# Patient Record
Sex: Female | Born: 1970 | Race: Black or African American | Hispanic: No | Marital: Married | Smoking: Never smoker
Health system: Southern US, Community
[De-identification: ages and names within clinical notes are randomized; demographics above are authoritative.]

## PROBLEM LIST (undated history)

## (undated) ENCOUNTER — Emergency Department (HOSPITAL_COMMUNITY): Payer: BC Managed Care – PPO | Source: Home / Self Care

## (undated) DIAGNOSIS — D509 Iron deficiency anemia, unspecified: Secondary | ICD-10-CM

## (undated) DIAGNOSIS — D649 Anemia, unspecified: Secondary | ICD-10-CM

## (undated) DIAGNOSIS — E785 Hyperlipidemia, unspecified: Secondary | ICD-10-CM

## (undated) DIAGNOSIS — I10 Essential (primary) hypertension: Secondary | ICD-10-CM

## (undated) HISTORY — DX: Hyperlipidemia, unspecified: E78.5

## (undated) HISTORY — DX: Anemia, unspecified: D64.9

## (undated) HISTORY — DX: Iron deficiency anemia, unspecified: D50.9

---

## 2000-07-05 ENCOUNTER — Other Ambulatory Visit: Admission: RE | Admit: 2000-07-05 | Discharge: 2000-07-05 | Payer: Self-pay | Admitting: Obstetrics

## 2002-07-15 ENCOUNTER — Encounter: Payer: Self-pay | Admitting: Internal Medicine

## 2002-07-15 ENCOUNTER — Encounter: Admission: RE | Admit: 2002-07-15 | Discharge: 2002-07-15 | Payer: Self-pay | Admitting: Internal Medicine

## 2004-10-11 ENCOUNTER — Ambulatory Visit: Payer: Self-pay | Admitting: Internal Medicine

## 2005-07-18 ENCOUNTER — Ambulatory Visit (HOSPITAL_COMMUNITY): Admission: RE | Admit: 2005-07-18 | Discharge: 2005-07-18 | Payer: Self-pay | Admitting: Obstetrics

## 2006-12-26 ENCOUNTER — Ambulatory Visit: Payer: Self-pay | Admitting: Internal Medicine

## 2007-03-26 ENCOUNTER — Ambulatory Visit: Payer: Self-pay | Admitting: Internal Medicine

## 2007-03-26 ENCOUNTER — Encounter: Payer: Self-pay | Admitting: Internal Medicine

## 2007-03-26 DIAGNOSIS — I10 Essential (primary) hypertension: Secondary | ICD-10-CM | POA: Insufficient documentation

## 2007-03-26 LAB — CONVERTED CEMR LAB
Alkaline Phosphatase: 43 units/L (ref 39–117)
BUN: 4 mg/dL — ABNORMAL LOW (ref 6–23)
Basophils Relative: 1.1 % — ABNORMAL HIGH (ref 0.0–1.0)
Bilirubin, Direct: 0.3 mg/dL (ref 0.0–0.3)
CO2: 25 meq/L (ref 19–32)
Eosinophils Absolute: 0.1 10*3/uL (ref 0.0–0.6)
GFR calc Af Amer: 104 mL/min
GFR calc non Af Amer: 86 mL/min
Hemoglobin: 11.7 g/dL — ABNORMAL LOW (ref 12.0–15.0)
Lymphocytes Relative: 24.4 % (ref 12.0–46.0)
MCHC: 34.9 g/dL (ref 30.0–36.0)
MCV: 96 fL (ref 78.0–100.0)
Monocytes Absolute: 0.6 10*3/uL (ref 0.2–0.7)
Monocytes Relative: 12.8 % — ABNORMAL HIGH (ref 3.0–11.0)
Neutro Abs: 3 10*3/uL (ref 1.4–7.7)
Potassium: 4.2 meq/L (ref 3.5–5.1)
TSH: 1.86 microintl units/mL (ref 0.35–5.50)
Total Protein: 7.4 g/dL (ref 6.0–8.3)

## 2007-07-09 ENCOUNTER — Ambulatory Visit: Payer: Self-pay | Admitting: Internal Medicine

## 2007-07-09 DIAGNOSIS — R21 Rash and other nonspecific skin eruption: Secondary | ICD-10-CM | POA: Insufficient documentation

## 2008-01-09 ENCOUNTER — Ambulatory Visit: Payer: Self-pay | Admitting: Internal Medicine

## 2008-01-10 LAB — CONVERTED CEMR LAB
AST: 22 units/L (ref 0–37)
Albumin: 3.8 g/dL (ref 3.5–5.2)
Alkaline Phosphatase: 43 units/L (ref 39–117)
BUN: 7 mg/dL (ref 6–23)
CO2: 25 meq/L (ref 19–32)
Chloride: 102 meq/L (ref 96–112)
Eosinophils Relative: 1.3 % (ref 0.0–5.0)
Glucose, Bld: 103 mg/dL — ABNORMAL HIGH (ref 70–99)
HCT: 35.1 % — ABNORMAL LOW (ref 36.0–46.0)
Monocytes Relative: 6.4 % (ref 3.0–12.0)
Neutrophils Relative %: 72.1 % (ref 43.0–77.0)
Platelets: 307 10*3/uL (ref 150–400)
Potassium: 3.5 meq/L (ref 3.5–5.1)
Total Protein: 7.5 g/dL (ref 6.0–8.3)
WBC: 3.8 10*3/uL — ABNORMAL LOW (ref 4.5–10.5)

## 2008-01-15 ENCOUNTER — Ambulatory Visit: Payer: Self-pay | Admitting: Internal Medicine

## 2008-11-27 ENCOUNTER — Emergency Department (HOSPITAL_COMMUNITY): Admission: EM | Admit: 2008-11-27 | Discharge: 2008-11-27 | Payer: Self-pay | Admitting: Internal Medicine

## 2009-05-31 ENCOUNTER — Ambulatory Visit (HOSPITAL_COMMUNITY): Admission: RE | Admit: 2009-05-31 | Discharge: 2009-05-31 | Payer: Self-pay | Admitting: Obstetrics

## 2009-06-30 ENCOUNTER — Ambulatory Visit (HOSPITAL_COMMUNITY): Admission: RE | Admit: 2009-06-30 | Discharge: 2009-06-30 | Payer: Self-pay | Admitting: Obstetrics

## 2009-07-13 ENCOUNTER — Ambulatory Visit (HOSPITAL_COMMUNITY): Admission: RE | Admit: 2009-07-13 | Discharge: 2009-07-13 | Payer: Self-pay | Admitting: Obstetrics

## 2009-08-02 ENCOUNTER — Ambulatory Visit (HOSPITAL_COMMUNITY): Admission: RE | Admit: 2009-08-02 | Discharge: 2009-08-02 | Payer: Self-pay | Admitting: Obstetrics

## 2009-08-23 ENCOUNTER — Ambulatory Visit (HOSPITAL_COMMUNITY): Admission: RE | Admit: 2009-08-23 | Discharge: 2009-08-23 | Payer: Self-pay | Admitting: Obstetrics

## 2009-11-15 ENCOUNTER — Ambulatory Visit (HOSPITAL_COMMUNITY): Admission: RE | Admit: 2009-11-15 | Discharge: 2009-11-15 | Payer: Self-pay | Admitting: Obstetrics

## 2009-11-29 ENCOUNTER — Ambulatory Visit (HOSPITAL_COMMUNITY): Admission: RE | Admit: 2009-11-29 | Discharge: 2009-11-29 | Payer: Self-pay | Admitting: Obstetrics

## 2009-12-06 ENCOUNTER — Ambulatory Visit (HOSPITAL_COMMUNITY): Admission: RE | Admit: 2009-12-06 | Discharge: 2009-12-06 | Payer: Self-pay | Admitting: Obstetrics

## 2009-12-13 ENCOUNTER — Ambulatory Visit (HOSPITAL_COMMUNITY): Admission: RE | Admit: 2009-12-13 | Discharge: 2009-12-13 | Payer: Self-pay | Admitting: Obstetrics

## 2009-12-13 ENCOUNTER — Ambulatory Visit (HOSPITAL_COMMUNITY): Admission: AD | Admit: 2009-12-13 | Discharge: 2009-12-13 | Payer: Self-pay | Admitting: Obstetrics

## 2009-12-16 ENCOUNTER — Ambulatory Visit (HOSPITAL_COMMUNITY): Admission: RE | Admit: 2009-12-16 | Discharge: 2009-12-16 | Payer: Self-pay | Admitting: Obstetrics

## 2009-12-20 ENCOUNTER — Ambulatory Visit (HOSPITAL_COMMUNITY): Admission: RE | Admit: 2009-12-20 | Discharge: 2009-12-20 | Payer: Self-pay | Admitting: Obstetrics

## 2009-12-23 ENCOUNTER — Ambulatory Visit (HOSPITAL_COMMUNITY): Admission: RE | Admit: 2009-12-23 | Discharge: 2009-12-23 | Payer: Self-pay | Admitting: Obstetrics

## 2009-12-27 ENCOUNTER — Ambulatory Visit (HOSPITAL_COMMUNITY): Admission: RE | Admit: 2009-12-27 | Discharge: 2009-12-27 | Payer: Self-pay | Admitting: Obstetrics

## 2009-12-30 ENCOUNTER — Ambulatory Visit (HOSPITAL_COMMUNITY): Admission: RE | Admit: 2009-12-30 | Discharge: 2009-12-30 | Payer: Self-pay | Admitting: Obstetrics

## 2010-01-03 ENCOUNTER — Ambulatory Visit (HOSPITAL_COMMUNITY): Admission: RE | Admit: 2010-01-03 | Discharge: 2010-01-03 | Payer: Self-pay | Admitting: Obstetrics

## 2010-01-06 ENCOUNTER — Ambulatory Visit (HOSPITAL_COMMUNITY): Admission: RE | Admit: 2010-01-06 | Discharge: 2010-01-06 | Payer: Self-pay | Admitting: Obstetrics

## 2010-01-10 ENCOUNTER — Ambulatory Visit (HOSPITAL_COMMUNITY): Admission: RE | Admit: 2010-01-10 | Discharge: 2010-01-10 | Payer: Self-pay | Admitting: Obstetrics

## 2010-01-13 ENCOUNTER — Ambulatory Visit (HOSPITAL_COMMUNITY): Admission: RE | Admit: 2010-01-13 | Discharge: 2010-01-13 | Payer: Self-pay | Admitting: Obstetrics

## 2010-01-17 ENCOUNTER — Inpatient Hospital Stay (HOSPITAL_COMMUNITY): Admission: AD | Admit: 2010-01-17 | Discharge: 2010-01-17 | Payer: Self-pay | Admitting: Obstetrics

## 2010-01-19 ENCOUNTER — Inpatient Hospital Stay (HOSPITAL_COMMUNITY): Admission: RE | Admit: 2010-01-19 | Discharge: 2010-01-23 | Payer: Self-pay | Admitting: Obstetrics

## 2010-09-11 ENCOUNTER — Encounter: Payer: Self-pay | Admitting: Obstetrics

## 2010-11-07 LAB — CBC
HCT: 31.7 % — ABNORMAL LOW (ref 36.0–46.0)
MCHC: 35.3 g/dL (ref 30.0–36.0)
Platelets: 228 10*3/uL (ref 150–400)
RBC: 2.49 MIL/uL — ABNORMAL LOW (ref 3.87–5.11)
RDW: 13.7 % (ref 11.5–15.5)
WBC: 11.1 10*3/uL — ABNORMAL HIGH (ref 4.0–10.5)

## 2010-11-30 LAB — URINALYSIS, ROUTINE W REFLEX MICROSCOPIC
Glucose, UA: NEGATIVE mg/dL
Protein, ur: NEGATIVE mg/dL
Specific Gravity, Urine: 1.021 (ref 1.005–1.030)
pH: 5.5 (ref 5.0–8.0)

## 2010-11-30 LAB — URINE MICROSCOPIC-ADD ON

## 2010-11-30 LAB — POCT I-STAT, CHEM 8
BUN: 9 mg/dL (ref 6–23)
Creatinine, Ser: 1 mg/dL (ref 0.4–1.2)
Sodium: 138 mEq/L (ref 135–145)
TCO2: 23 mmol/L (ref 0–100)

## 2010-11-30 LAB — CBC
MCV: 95.1 fL (ref 78.0–100.0)
RBC: 3.23 MIL/uL — ABNORMAL LOW (ref 3.87–5.11)
WBC: 6.5 10*3/uL (ref 4.0–10.5)

## 2011-01-06 NOTE — Assessment & Plan Note (Signed)
[facility]                                 ON-CALL NOTE   MELVIN, MARMO                        MRN:          [id number]  DATE:01/17/2007                            DOB:          May 19, 1971    TIME:  6:53 p.m.   PHONE NUMBER:  [phone].   OBJECTIVE:  Patient needs medications.  Pt called earlier for  Allegra-  D.  Her symptoms: her eyes and nose are running and itching.  Typically  uses Allegra-D.  She has past medical history significant only for  hypertension.   ASSESSMENT:  Allergies with hypertension.   PLAN:  Suggest she avoid the D if possible.  She has been on plain  Allegra before, said it was not effective.  Suggested she try plain  Zyrtec or cetirizine, which she can get over the counter.  If that is  not totally acceptable, would call and be seen, possibly use nasal  inhaler.   PRIMARY CARE PHYSICIAN:  Georgina Quint. Plotnikov, M.D.   HOME OFFICE:  Hope Budds, MD  Electronically Signed    RNS/MedQ  DD: 01/17/2007  DT: 01/18/2007  Job #: [id number]

## 2011-06-06 ENCOUNTER — Other Ambulatory Visit (HOSPITAL_COMMUNITY): Payer: Self-pay | Admitting: Obstetrics

## 2011-06-06 DIAGNOSIS — Z1231 Encounter for screening mammogram for malignant neoplasm of breast: Secondary | ICD-10-CM

## 2011-06-23 ENCOUNTER — Ambulatory Visit (HOSPITAL_COMMUNITY)
Admission: RE | Admit: 2011-06-23 | Discharge: 2011-06-23 | Disposition: A | Discharge status: Home / Self Care | Payer: Self-pay | Source: Ambulatory Visit | Attending: Obstetrics | Admitting: Obstetrics

## 2011-06-23 DIAGNOSIS — Z1231 Encounter for screening mammogram for malignant neoplasm of breast: Secondary | ICD-10-CM | POA: Insufficient documentation

## 2011-06-27 LAB — HM MAMMOGRAPHY: HM Mammogram: NORMAL

## 2011-09-06 IMAGING — US US OB LIMITED
1 series · 14 of 27 positions shown · non-contrast
Comparison: none

OBSTETRICAL ULTRASOUND:
 This ultrasound was performed in The [HOSPITAL], and the [facility] report will be stored to [REDACTED] PACS.  This report is also available in [HOSPITAL]?s accessANYware.

[Series 1: us ob limited · 14 of 27 slices shown]
[im 1/27]
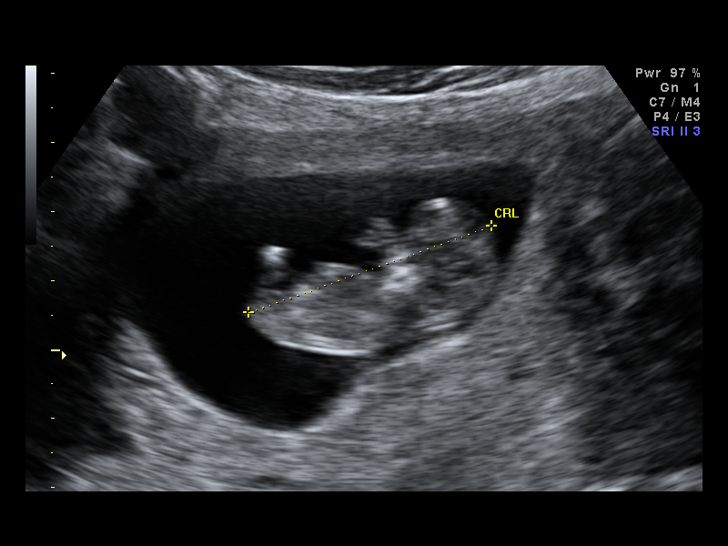
[im 3/27]
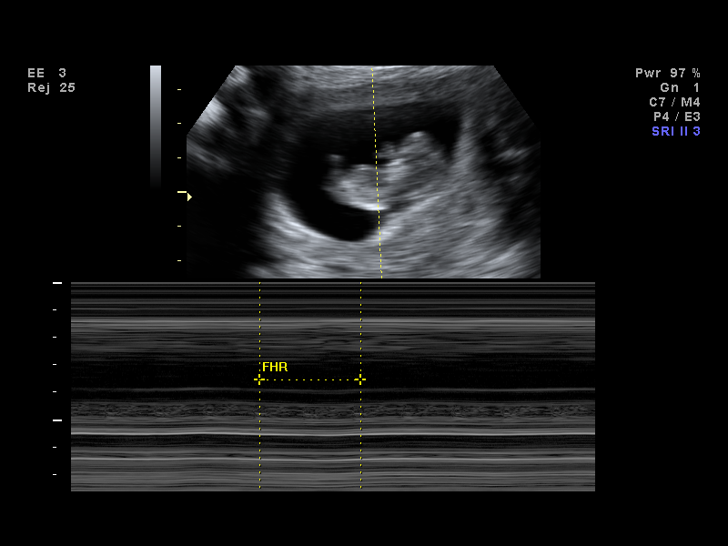
[im 5/27]
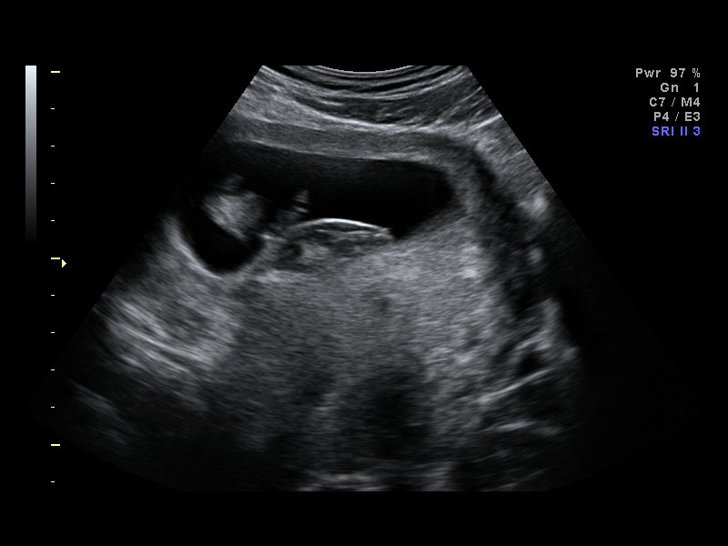
[im 7/27]
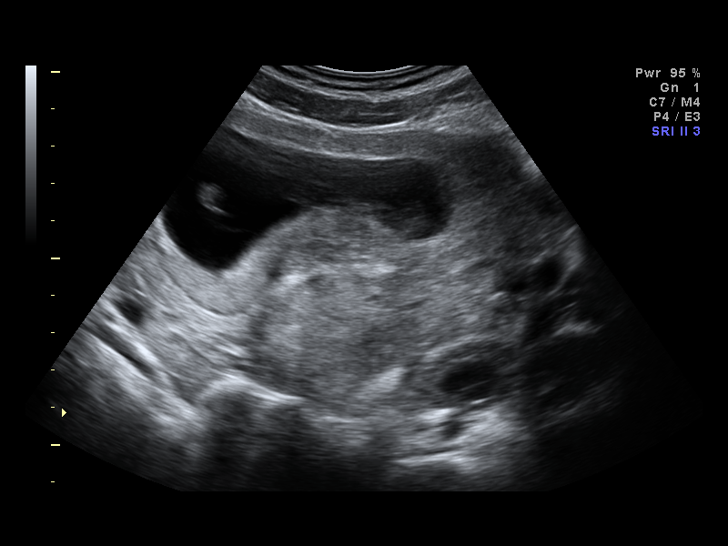
[im 9/27]
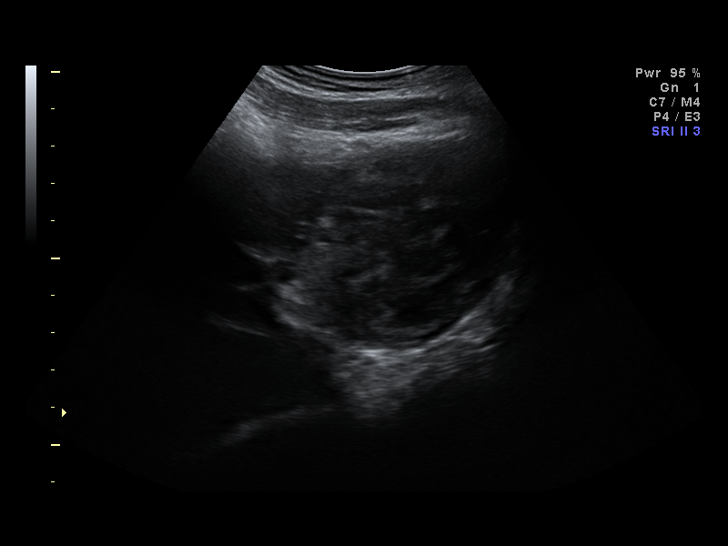
[im 11/27]
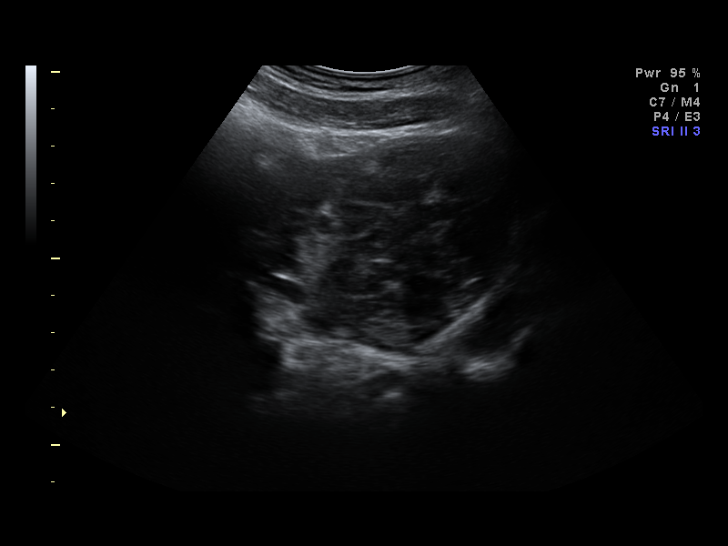
[im 13/27]
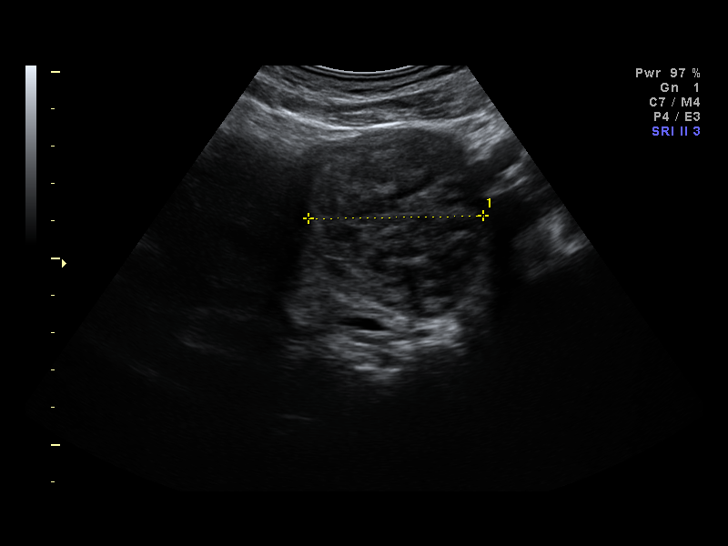
[im 15/27]
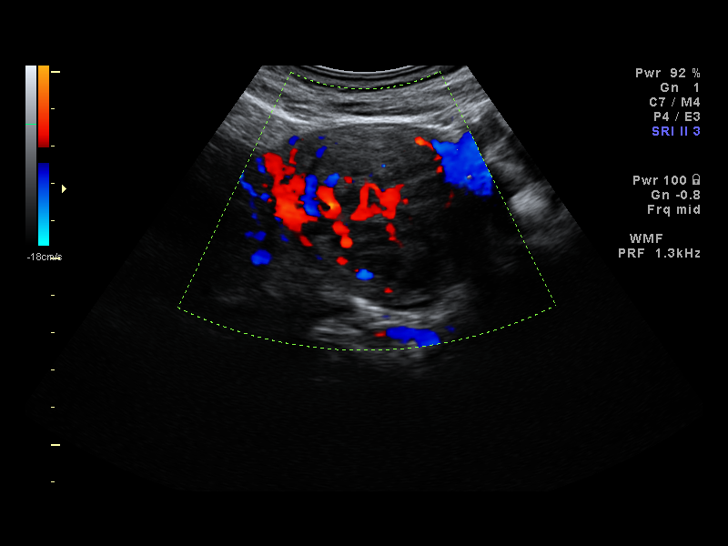
[im 17/27]
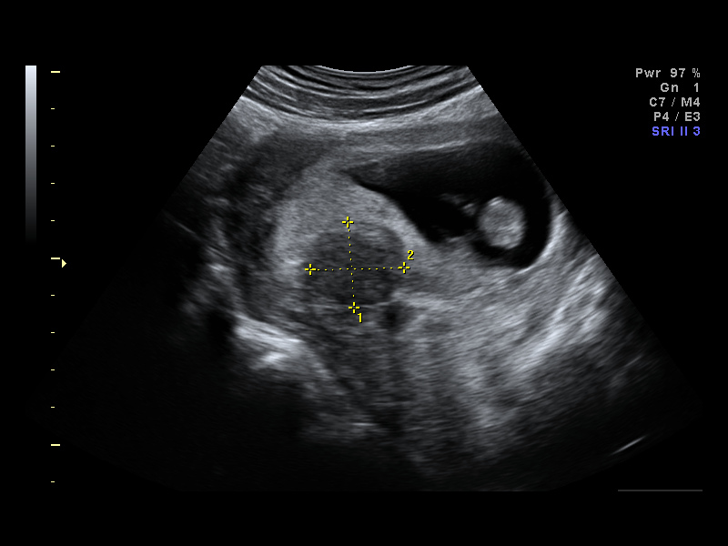
[im 19/27]
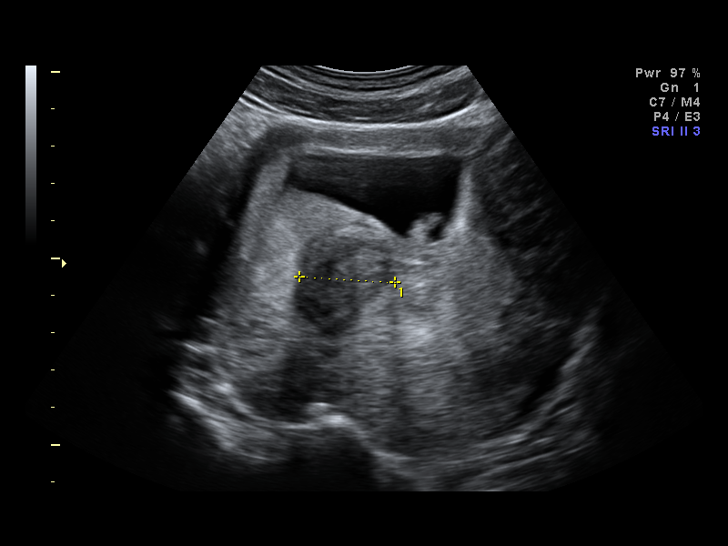
[im 21/27]
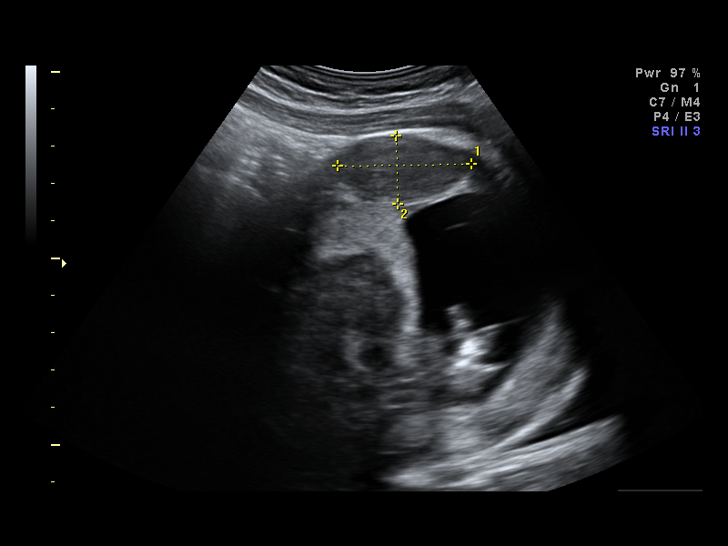
[im 23/27]
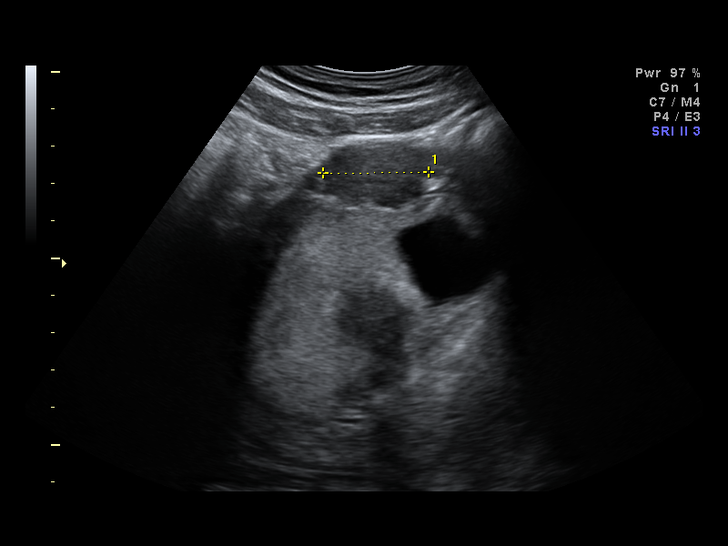
[im 25/27]
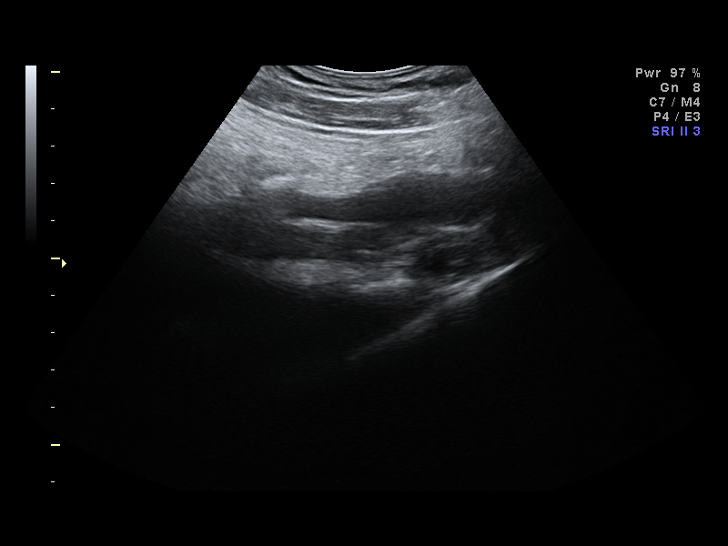
[im 27/27]
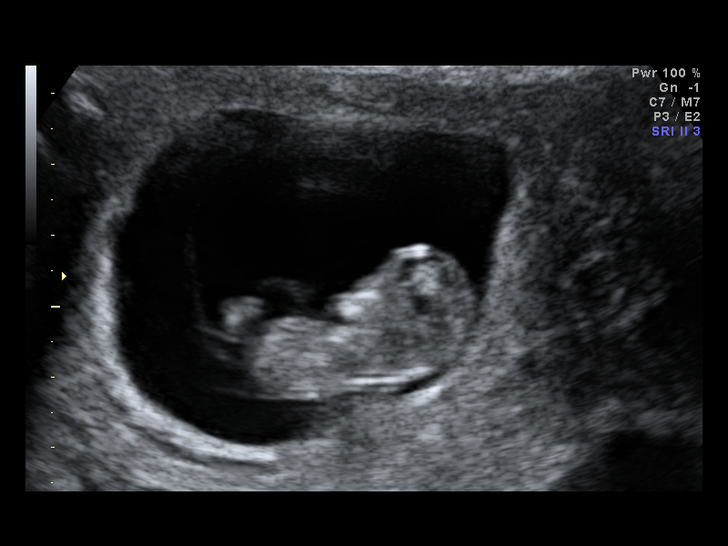

[14 of 27 positions shown; findings below may reference images not displayed]

IMPRESSION: [facility] has also been faxed to the ordering physician.

## 2012-01-01 ENCOUNTER — Ambulatory Visit: Payer: BC Managed Care – PPO

## 2012-01-03 ENCOUNTER — Ambulatory Visit (INDEPENDENT_AMBULATORY_CARE_PROVIDER_SITE_OTHER): Payer: BC Managed Care – PPO | Admitting: Family Medicine

## 2012-01-03 ENCOUNTER — Encounter: Payer: Self-pay | Admitting: Family Medicine

## 2012-01-03 DIAGNOSIS — I498 Other specified cardiac arrhythmias: Secondary | ICD-10-CM

## 2012-01-03 DIAGNOSIS — J309 Allergic rhinitis, unspecified: Secondary | ICD-10-CM | POA: Insufficient documentation

## 2012-01-03 DIAGNOSIS — R05 Cough: Secondary | ICD-10-CM

## 2012-01-03 DIAGNOSIS — R000 Tachycardia, unspecified: Secondary | ICD-10-CM

## 2012-01-03 DIAGNOSIS — Z862 Personal history of diseases of the blood and blood-forming organs and certain disorders involving the immune mechanism: Secondary | ICD-10-CM

## 2012-01-03 MED ORDER — FLUTICASONE PROPIONATE 50 MCG/ACT NA SUSP: 2.0000 | Freq: Every day | NASAL | Status: DC

## 2012-01-03 NOTE — Progress Notes (Signed)
  Subjective:    Patient ID: Tabitha Matthews, female    DOB: 11-Jun-1971, 41 y.o.   MRN: [id number]  HPI   This 41 y.o. AA female was last seen here in May 2009 when she was diagnosed with sinusitis  and bronchitis. She presents today with 2-week hx of nonprod. cough. She has very mild allergic  symptoms but denies post-nasal drip or AM congestion. 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 She has no pets but does   work in a dusty environment. Some suspicious areas around the vents tested negative for mold.  She has not taken OTC meds for this. She denies hx of Asthma.          She had GYN exam with Dr.Marshall and was told she is Vit D deficient but he could not prescribe the   medication. She also has a hx of anemia; she took an iron supplement throughout her pregnancy 2 years ago.    Review of Systems  Constitutional: Negative.  Negative for fever, chills, appetite change and fatigue.  HENT: Positive for rhinorrhea and sinus pressure. Negative for nosebleeds, congestion, sore throat, sneezing and postnasal drip.   Eyes: Negative for redness and itching.  Respiratory: Positive for cough, chest tightness and wheezing. Negative for shortness of breath.   Cardiovascular: Negative for chest pain and palpitations.  Skin: Negative.   Neurological: Negative.        Objective:   Physical Exam  Nursing note and vitals reviewed. Constitutional: She is oriented to person, place, and time. She appears well-developed and well-nourished. No distress.  HENT:  Head: Normocephalic and atraumatic.  Right Ear: External ear normal.  Left Ear: External ear normal.  Mouth/Throat: Oropharynx is clear and moist.       Posterior pharynx mildly erythematous; nasal turbinates edematous and red with clear mucous  Neck: Normal range of motion. Neck supple.       Thyroid mildly enlarged  Cardiovascular: Exam reveals no gallop  and no friction rub.   No murmur heard.      Sinus tachycardia with prominent S1 and S2  Pulmonary/Chest: Effort normal and breath sounds normal. No respiratory distress. She has no wheezes.  Musculoskeletal: Normal range of motion. She exhibits no edema.  Lymphadenopathy:    She has no cervical adenopathy.  Neurological: She is alert and oriented to person, place, and time. No cranial nerve deficit.  Skin: Skin is warm and dry.  Psychiatric: She has a normal mood and affect. Her behavior is normal.          Assessment & Plan:   1. Allergic rhinitis  OTC Cetirizine 10 mg  1 tablet every evening;RX: Fluticasone NS  2. Allergic cough   3. History of anemia          Pt to sign R.O.I. For records from GYN  4. Sinus tachycardia         Monitor; pt has HTN - on Amlodipine (may be having tachyphylaxis)

## 2012-01-03 NOTE — Patient Instructions (Signed)
Cough, Adult  A cough is a reflex that helps clear your throat and airways. It can help heal the body or may be a reaction to an irritated airway. A cough may only last 2 or 3 weeks (acute) or may last more than 8 weeks (chronic).  CAUSES Acute cough: Viral or bacterial infections.  Chronic cough: Infections.  Allergies.  Asthma.  Post-nasal drip.  Smoking.  Heartburn or acid reflux.  Some medicines.  Chronic lung problems (COPD).  Cancer.  SYMPTOMS  Cough.  Fever.  Chest pain.  Increased breathing rate.  High-pitched whistling sound when breathing (wheezing).  Colored mucus that you cough up (sputum).  TREATMENT  A bacterial cough may be treated with antibiotic medicine.  A viral cough must run its course and will not respond to antibiotics.  Your caregiver may recommend other treatments if you have a chronic cough.  HOME CARE INSTRUCTIONS  Only take over-the-counter or prescription medicines for pain, discomfort, or fever as directed by your caregiver. Use cough suppressants only as directed by your caregiver.  Use a cold steam vaporizer or humidifier in your bedroom or home to help loosen secretions.  Sleep in a semi-upright position if your cough is worse at night.  Rest as needed.  Stop smoking if you smoke.  SEEK IMMEDIATE MEDICAL CARE IF:  You have pus in your sputum.  Your cough starts to worsen.  You cannot control your cough with suppressants and are losing sleep.  You begin coughing up blood.  You have difficulty breathing.  You develop pain which is getting worse or is uncontrolled with medicine.  You have a fever.  MAKE SURE YOU:  Understand these instructions.  Will watch your condition.  Will get help right away if you are not doing well or get worse.  Document Released: 02/03/2011 Document Revised: 07/27/2011 Document Reviewed: 02/03/2011 [employer] Patient Information 2012 [employer], [place]    Anemia, Nonspecific Your exam and blood tests show you are  anemic. This means your blood (hemoglobin) level is low. Normal hemoglobin values are 12 to 15 g/dL for females and 14 to 17 g/dL for males. Make a note of your hemoglobin level today. The hematocrit percent is also used to measure anemia. A normal hematocrit is 38% to 46% in females and 42% to 49% in males. Make a note of your hematocrit level today. CAUSES  Anemia can be due to many different causes.  Excessive bleeding from periods (in women).   Intestinal bleeding.   Poor nutrition.   Kidney, thyroid, liver, and bone marrow diseases.  SYMPTOMS  Anemia can come on suddenly (acute). It can also come on slowly. Symptoms can include:  Minor weakness.   Dizziness.   Palpitations.   Shortness of breath.  Symptoms may be absent until half your hemoglobin is missing if it comes on slowly. Anemia due to acute blood loss from an injury or internal bleeding may require blood transfusion if the loss is severe. Hospital care is needed if you are anemic and there is significant continual blood loss. TREATMENT   Stool tests for blood (Hemoccult) and additional lab tests are often needed. This determines the best treatment.   Further checking on your condition and your response to treatment is very important. It often takes many weeks to correct anemia.  Depending on the cause, treatment can include:  Supplements of iron.   Vitamins B12 and folic acid.   Hormone medicines.If your anemia is due to bleeding, finding the cause of the  blood loss is very important. This will help avoid further problems.  SEEK IMMEDIATE MEDICAL CARE IF:   You develop fainting, extreme weakness, shortness of breath, or chest pain.   You develop heavy vaginal bleeding.   You develop bloody or black, tarry stools or vomit up blood.   You develop a high fever, rash, repeated vomiting, or dehydration.  Document Released: 09/14/2004 Document Revised: 07/27/2011 Document Reviewed: 06/22/2009 [facility] Patient  Information 2012 [place], [place].     Marland KitchenAllergic Rhinitis Allergic rhinitis is when the mucous membranes in the nose respond to allergens. Allergens are particles in the air that cause your body to have an allergic reaction. This causes you to release allergic antibodies. Through a chain of events, these eventually cause you to release histamine into the blood stream (hence the use of antihistamines). Although meant to be protective to the body, it is this release that causes your discomfort, such as frequent sneezing, congestion and an itchy runny nose.  CAUSES  The pollen allergens may come from grasses, trees, and weeds. This is seasonal allergic rhinitis, or "hay fever." Other allergens cause year-round allergic rhinitis (perennial allergic rhinitis) such as house dust mite allergen, pet dander and mold spores.  SYMPTOMS   Nasal stuffiness (congestion).   Runny, itchy nose with sneezing and tearing of the eyes.   There is often an itching of the mouth, eyes and ears.  It cannot be cured, but it can be controlled with medications. DIAGNOSIS  If you are unable to determine the offending allergen, skin or blood testing may find it. TREATMENT   Avoid the allergen.   Medications and allergy shots (immunotherapy) can help.   Hay fever may often be treated with antihistamines in pill or nasal spray forms. Antihistamines block the effects of histamine. There are over-the-counter medicines that may help with nasal congestion and swelling around the eyes. Check with your caregiver before taking or giving this medicine.  If the treatment above does not work, there are many new medications your caregiver can prescribe. Stronger medications may be used if initial measures are ineffective. Desensitizing injections can be used if medications and avoidance fails. Desensitization is when a patient is given ongoing shots until the body becomes less sensitive to the allergen. Make sure you follow up with  your caregiver if problems continue. SEEK MEDICAL CARE IF:   You develop fever (more than 100.5 F (38.1 C).   You develop a cough that does not stop easily (persistent).   You have shortness of breath.   You start wheezing.   Symptoms interfere with normal daily activities.  Document Released: 05/02/2001 Document Revised: 07/27/2011 Document Reviewed: 11/11/2008 [facility] Patient Information 2012 [place], [place].    For Allergies- get OTC Zyrtec 10 mg tablets and take 1 tablet every evening. Also, Fluticasone Nasal Spray has been prescribed; spray 2 times in each nostril at bedtime (it will take about 2 weeks before you notice an improvement). Also try throat lozenges and cough drops until your medications become fully effective.   We will contact you regarding any other medications you may need once we have received your records from MR. Gaynell Face.

## 2012-01-21 ENCOUNTER — Other Ambulatory Visit: Payer: Self-pay | Admitting: Family Medicine

## 2012-01-22 IMAGING — US US OB FOLLOW-UP
1 series · 18 of 28 positions shown · non-contrast
Comparison: none

OBSTETRICAL ULTRASOUND:
 This ultrasound was performed in The [HOSPITAL], and the [facility] report will be stored to [REDACTED] PACS.  This report is also available in [HOSPITAL]?s accessANYware.

[Series 1: us ob follow-up · 18 of 49 slices shown]
[im 1/49]
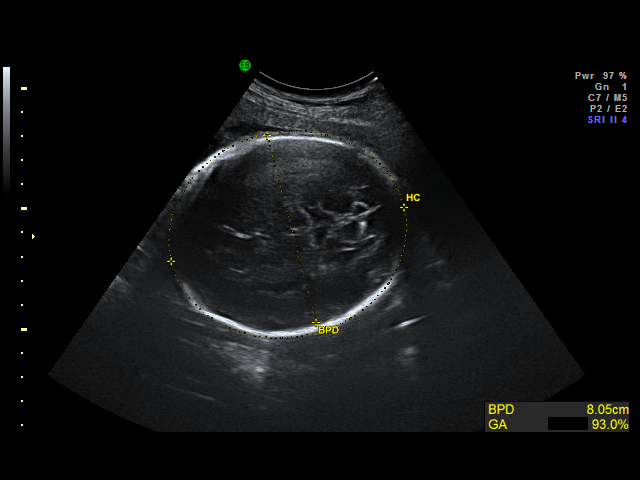
[im 4/49]
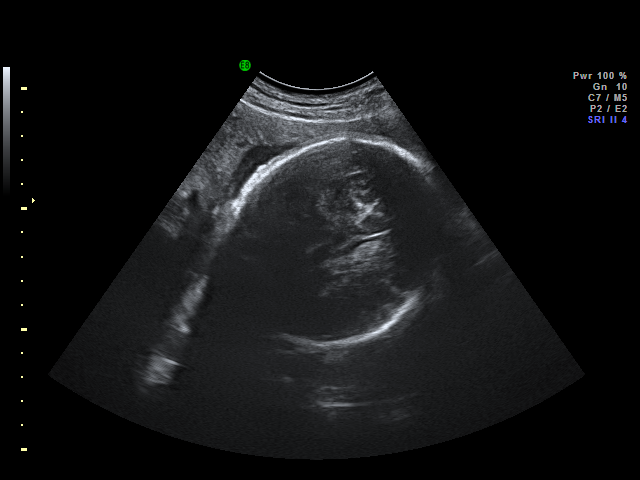
[im 6/49]
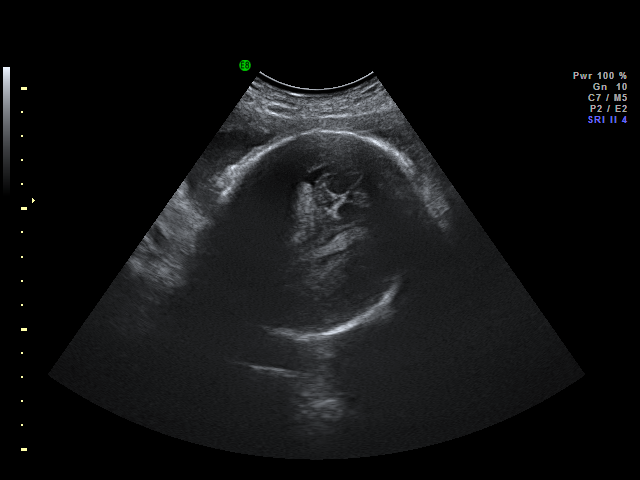
[im 9/49]
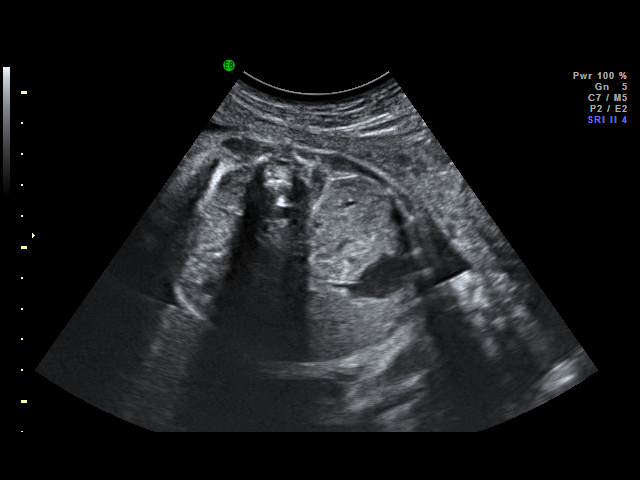
[im 13/49]
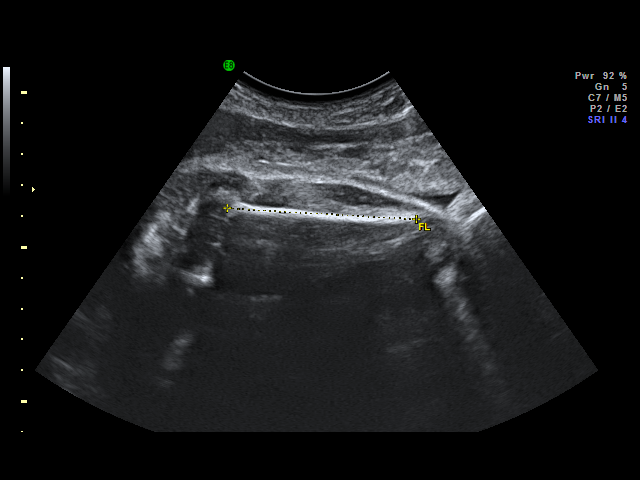
[im 15/49]
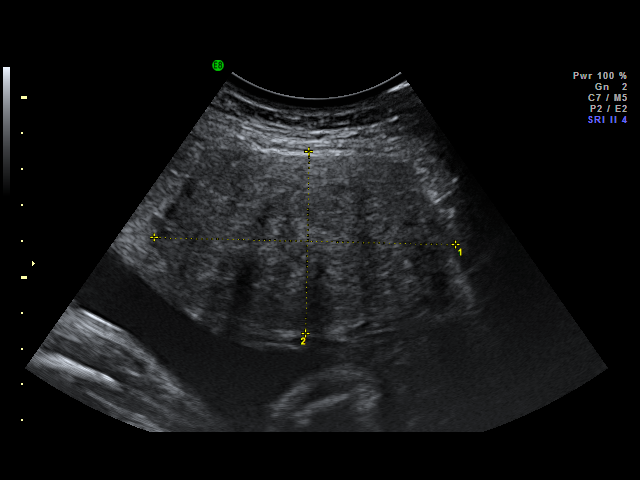
[im 18/49]
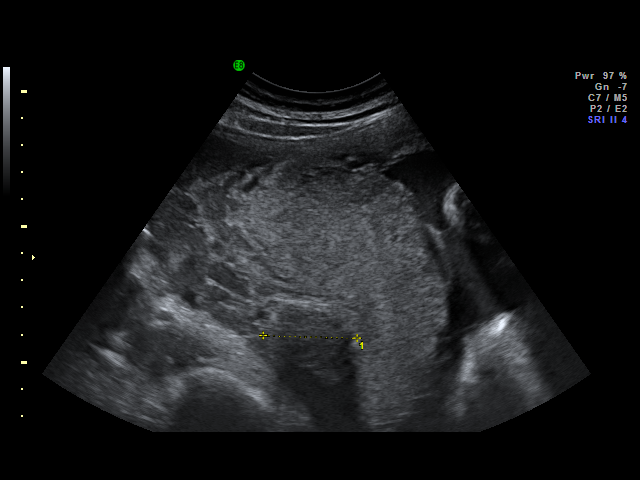
[im 20/49]
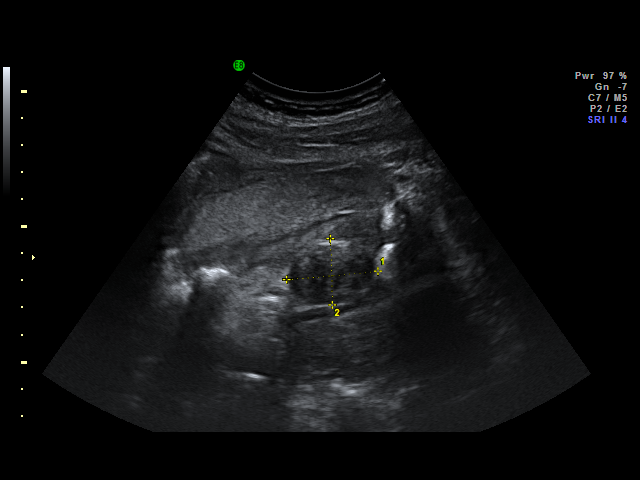
[im 24/49]
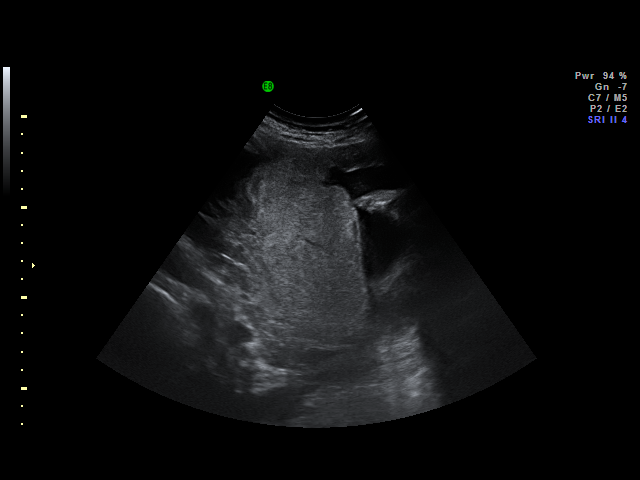
[im 25/49]
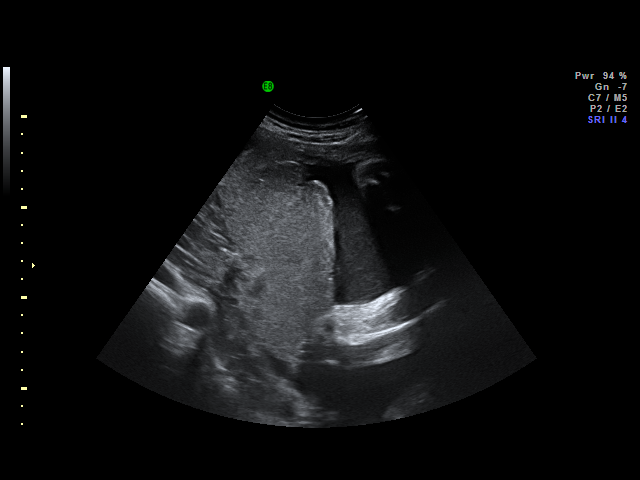
[im 29/49]
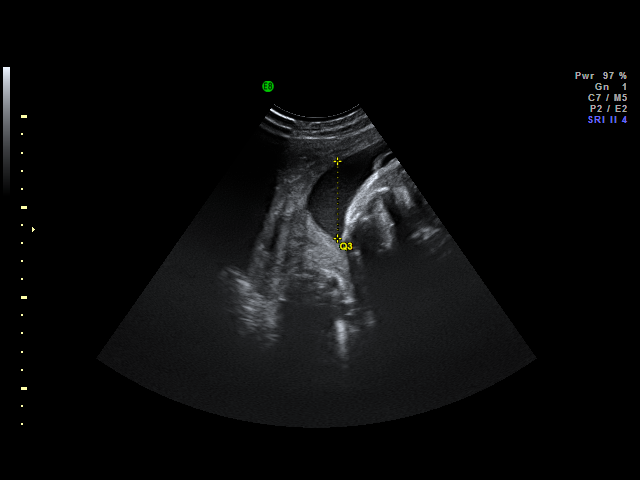
[im 31/49]
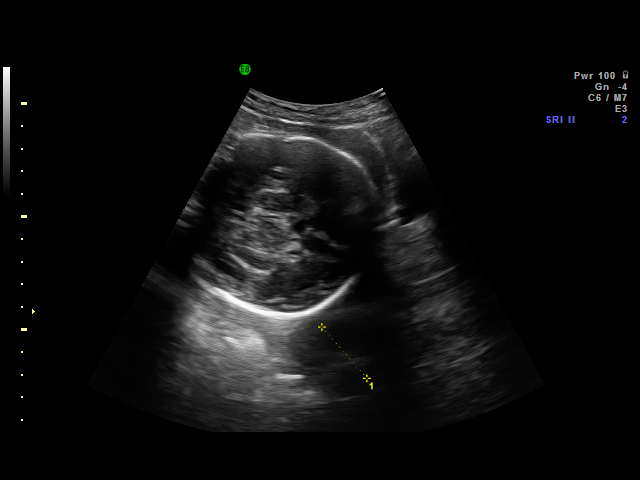
[im 34/49]
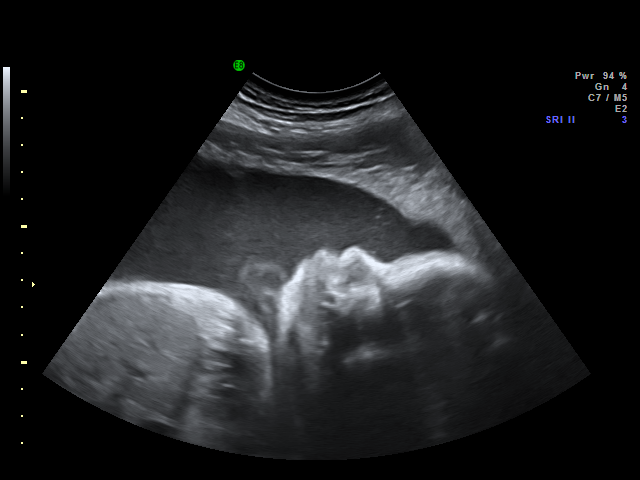
[im 38/49]
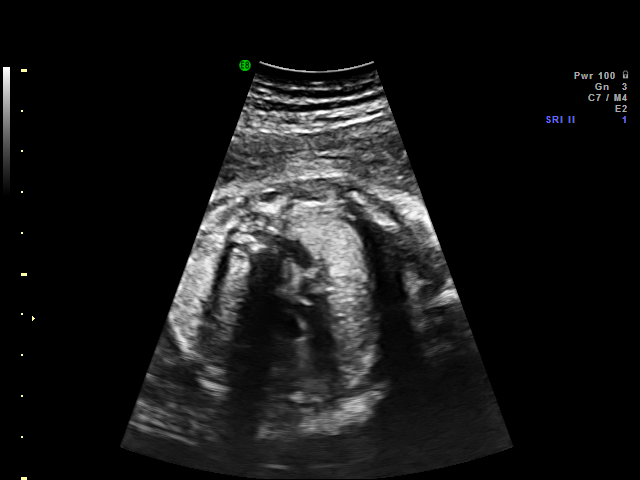
[im 40/49]
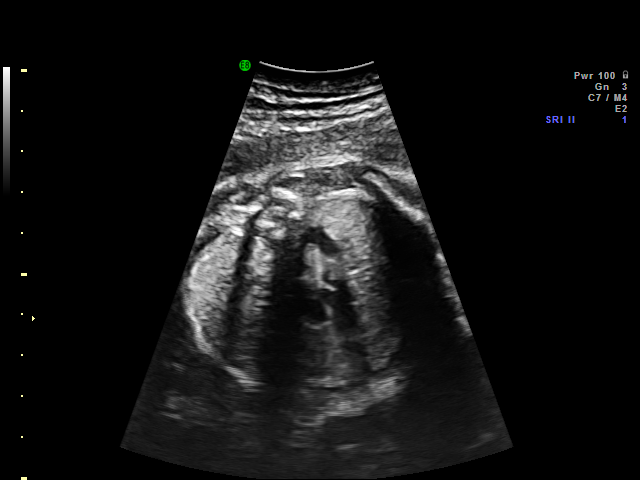
[im 43/49]
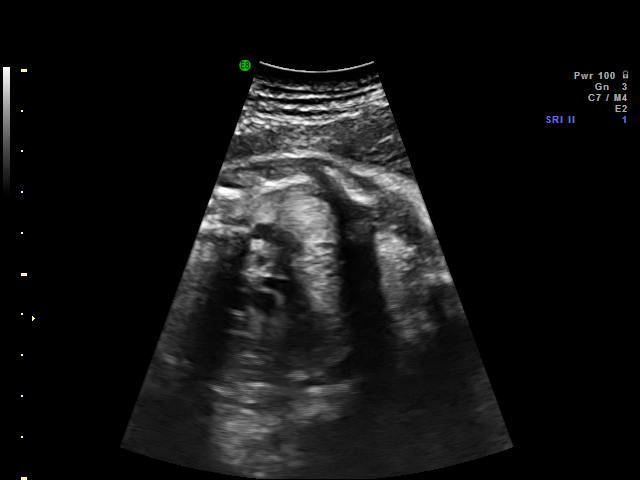
[im 45/49]
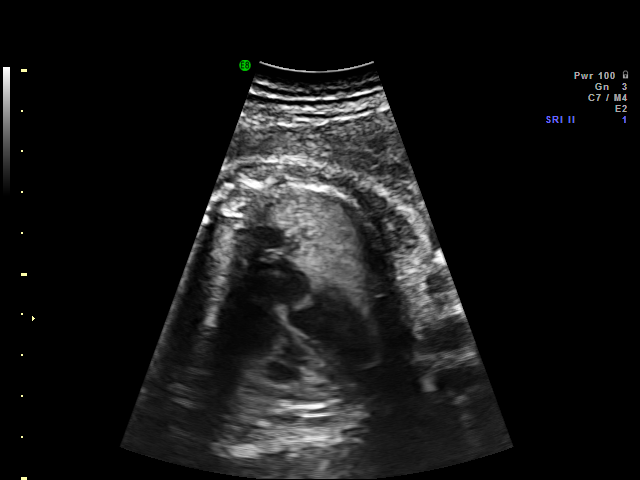
[im 49/49]
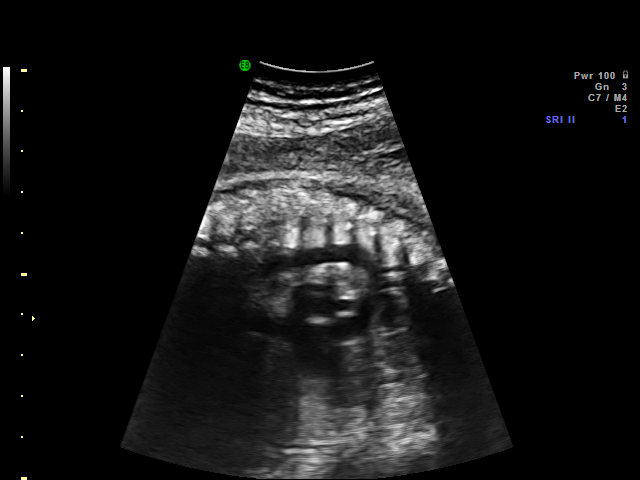

[18 of 28 positions shown; findings below may reference images not displayed]

IMPRESSION: [facility] has also been faxed to the ordering physician.

## 2012-06-18 ENCOUNTER — Telehealth: Payer: Self-pay | Admitting: Internal Medicine

## 2012-06-18 NOTE — Telephone Encounter (Signed)
Patient was a previous patient of Dr Posey Rea but she would like to switch to Dr Yetta Barre because she said she "did not connect well with Plotnikov", would you be willing to accept (last OV 2009)

## 2012-06-18 NOTE — Telephone Encounter (Signed)
OK w/me Thx 

## 2012-06-18 NOTE — Telephone Encounter (Signed)
yes

## 2012-06-19 NOTE — Telephone Encounter (Signed)
Patient scheduled 08/15/2012 with Dr Yetta Barre

## 2012-08-15 ENCOUNTER — Telehealth: Payer: Self-pay | Admitting: Internal Medicine

## 2012-08-15 ENCOUNTER — Ambulatory Visit: Payer: Self-pay | Admitting: Internal Medicine

## 2012-08-15 DIAGNOSIS — I10 Essential (primary) hypertension: Secondary | ICD-10-CM

## 2012-08-15 NOTE — Telephone Encounter (Signed)
Patient was on your schedule for 08/13/12 and we had to r/s her appointment until 09/04/11, she will run out of her blood pressure medication next week (08/23/11) and she is wondering if there is anyway to work her in this week so that she will not be without her blood pressure medication for two weeks, please advised thanks

## 2012-08-18 MED ORDER — AMLODIPINE BESYLATE 10 MG PO TABS: 10.0000 mg | ORAL_TABLET | Freq: Every day | ORAL | Status: DC

## 2012-08-18 NOTE — Telephone Encounter (Signed)
done

## 2012-08-19 NOTE — Telephone Encounter (Signed)
Pt informed

## 2012-09-03 ENCOUNTER — Encounter: Payer: Self-pay | Admitting: Internal Medicine

## 2012-09-03 ENCOUNTER — Ambulatory Visit (INDEPENDENT_AMBULATORY_CARE_PROVIDER_SITE_OTHER): Payer: BC Managed Care – PPO | Admitting: Internal Medicine

## 2012-09-03 ENCOUNTER — Other Ambulatory Visit (INDEPENDENT_AMBULATORY_CARE_PROVIDER_SITE_OTHER): Payer: BC Managed Care – PPO

## 2012-09-03 VITALS — BP 120/78 | HR 96 | Temp 98.5°F | Resp 16 | Ht 67.0 in | Wt 151.2 lb

## 2012-09-03 DIAGNOSIS — I10 Essential (primary) hypertension: Secondary | ICD-10-CM

## 2012-09-03 DIAGNOSIS — D509 Iron deficiency anemia, unspecified: Secondary | ICD-10-CM | POA: Insufficient documentation

## 2012-09-03 DIAGNOSIS — E559 Vitamin D deficiency, unspecified: Secondary | ICD-10-CM

## 2012-09-03 DIAGNOSIS — D649 Anemia, unspecified: Secondary | ICD-10-CM

## 2012-09-03 DIAGNOSIS — Z1231 Encounter for screening mammogram for malignant neoplasm of breast: Secondary | ICD-10-CM

## 2012-09-03 LAB — CBC WITH DIFFERENTIAL/PLATELET
Basophils Relative: 0.7 % (ref 0.0–3.0)
Eosinophils Relative: 1.5 % (ref 0.0–5.0)
HCT: 30.5 % — ABNORMAL LOW (ref 36.0–46.0)
MCV: 88.6 fl (ref 78.0–100.0)
Monocytes Absolute: 0.4 10*3/uL (ref 0.1–1.0)
Monocytes Relative: 9.2 % (ref 3.0–12.0)
Neutrophils Relative %: 66.2 % (ref 43.0–77.0)
RBC: 3.45 Mil/uL — ABNORMAL LOW (ref 3.87–5.11)
WBC: 4.4 10*3/uL — ABNORMAL LOW (ref 4.5–10.5)

## 2012-09-03 LAB — VITAMIN B12: Vitamin B-12: 790 pg/mL (ref 211–911)

## 2012-09-03 LAB — COMPREHENSIVE METABOLIC PANEL
Albumin: 4.1 g/dL (ref 3.5–5.2)
Alkaline Phosphatase: 52 U/L (ref 39–117)
BUN: 9 mg/dL (ref 6–23)
CO2: 26 mEq/L (ref 19–32)
Calcium: 9.2 mg/dL (ref 8.4–10.5)
GFR: 94.21 mL/min (ref >60.00)
Glucose, Bld: 96 mg/dL (ref 70–99)
Potassium: 3.9 mEq/L (ref 3.5–5.1)
Sodium: 138 mEq/L (ref 135–145)
Total Protein: 8 g/dL (ref 6.0–8.3)

## 2012-09-03 LAB — IBC PANEL
Iron: 161 ug/dL — ABNORMAL HIGH (ref 42–145)
Saturation Ratios: 38.9 % (ref 20.0–50.0)

## 2012-09-03 LAB — URINALYSIS, ROUTINE W REFLEX MICROSCOPIC
Bilirubin Urine: NEGATIVE
Hgb urine dipstick: NEGATIVE
Ketones, ur: NEGATIVE
Total Protein, Urine: NEGATIVE
Urine Glucose: NEGATIVE

## 2012-09-03 LAB — LIPID PANEL
Cholesterol: 179 mg/dL (ref 0–200)
LDL Cholesterol: 119 mg/dL — ABNORMAL HIGH (ref 0–99)

## 2012-09-03 LAB — FERRITIN: Ferritin: 4.9 ng/mL — ABNORMAL LOW (ref 10.0–291.0)

## 2012-09-03 LAB — FOLATE: Folate: 11.2 ng/mL (ref >5.9)

## 2012-09-03 MED ORDER — AMLODIPINE BESYLATE 10 MG PO TABS: 10.0000 mg | ORAL_TABLET | Freq: Every day | ORAL | Status: DC

## 2012-09-03 NOTE — Progress Notes (Signed)
  Subjective:    Patient ID: Tabitha Matthews, female    DOB: 09-Apr-1971, 42 y.o.   MRN: [id number]  Anemia Presents for follow-up visit. Symptoms include malaise/fatigue and pica. There has been no abdominal pain, anorexia, bruising/bleeding easily, confusion, fever, leg swelling, light-headedness, pallor, palpitations, paresthesias or weight loss. Signs of blood loss that are present include menorrhagia. Signs of blood loss that are not present include hematemesis, hematochezia, melena and vaginal bleeding. Compliance problems include psychosocial issues.  Compliance with medications is 0-25%.      Review of Systems  Constitutional: Positive for malaise/fatigue and fatigue. Negative for fever, chills, weight loss, diaphoresis, activity change, appetite change and unexpected weight change.  HENT: Negative.   Eyes: Negative.   Respiratory: Negative.   Cardiovascular: Negative for chest pain, palpitations and leg swelling.  Gastrointestinal: Negative for nausea, vomiting, abdominal pain, diarrhea, constipation, blood in stool, melena, hematochezia, anal bleeding, anorexia and hematemesis.  Genitourinary: Positive for menorrhagia. Negative for vaginal bleeding.  Musculoskeletal: Negative.   Skin: Negative for color change, pallor, rash and wound.  Neurological: Negative for dizziness, tremors, seizures, syncope, facial asymmetry, speech difficulty, weakness, light-headedness, numbness, headaches and paresthesias.  Hematological: Negative for adenopathy. Does not bruise/bleed easily.  Psychiatric/Behavioral: Positive for dysphoric mood (irriitable and angry). Negative for suicidal ideas, hallucinations, behavioral problems, confusion, sleep disturbance, self-injury, decreased concentration and agitation. The patient is not nervous/anxious and is not hyperactive.        Objective:   Physical Exam  Vitals reviewed. Constitutional: She is oriented to person, place, and time. She appears  well-developed and well-nourished. No distress.  HENT:  Head: Normocephalic and atraumatic.  Mouth/Throat: Oropharynx is clear and moist. No oropharyngeal exudate.  Eyes: Conjunctivae normal are normal. Right eye exhibits no discharge. Left eye exhibits no discharge. No scleral icterus.  Neck: Normal range of motion. Neck supple. No JVD present. No tracheal deviation present. No thyromegaly present.  Cardiovascular: Normal rate, regular rhythm, normal heart sounds and intact distal pulses.  Exam reveals no gallop and no friction rub.   No murmur heard. Pulmonary/Chest: Effort normal and breath sounds normal. No stridor. No respiratory distress. She has no wheezes. She has no rales. She exhibits no tenderness.  Abdominal: Soft. Bowel sounds are normal. She exhibits no distension and no mass. There is no tenderness. There is no rebound and no guarding.  Musculoskeletal: Normal range of motion. She exhibits no edema and no tenderness.  Lymphadenopathy:    She has no cervical adenopathy.  Neurological: She is oriented to person, place, and time.  Skin: Skin is warm and dry. No rash noted. She is not diaphoretic. No erythema. No pallor.  Psychiatric: She has a normal mood and affect. Her behavior is normal. Judgment and thought content normal.      Lab Results  Component Value Date   WBC 11.1* 01/21/2010   HGB 8.7 DELTA CHECK NOTED REPEATED TO VERIFY* 01/21/2010   HCT 24.7* 01/21/2010   PLT 165 DELTA CHECK NOTED SPECIMEN CHECKED FOR CLOTS REPEATED TO VERIFY 01/21/2010   GLUCOSE 96 11/27/2008   ALT 12 01/09/2008   AST 22 01/09/2008   NA 138 11/27/2008   K 3.6 11/27/2008   CL 107 11/27/2008   CREATININE 1.0 11/27/2008   BUN 9 11/27/2008   CO2 25 01/09/2008   TSH 1.86 03/26/2007      Assessment & Plan:

## 2012-09-03 NOTE — Assessment & Plan Note (Signed)
I will recheck her vit D level today and will treat if needed

## 2012-09-03 NOTE — Assessment & Plan Note (Signed)
Her BP is well controlled Today I will check her lytes and renal function 

## 2012-09-03 NOTE — Assessment & Plan Note (Signed)
I will recheck her CBC and her vitamin levels today 

## 2012-09-03 NOTE — Patient Instructions (Signed)
Anemia, Nonspecific  Your exam and blood tests show you are anemic. This means your blood (hemoglobin) level is low. Normal hemoglobin values are 12 to 15 g/dL for females and 14 to 17 g/dL for males. Make a note of your hemoglobin level today. The hematocrit percent is also used to measure anemia. A normal hematocrit is 38% to 46% in females and 42% to 49% in males. Make a note of your hematocrit level today.  CAUSES   Anemia can be due to many different causes.   Excessive bleeding from periods (in women).   Intestinal bleeding.   Poor nutrition.   Kidney, thyroid, liver, and bone marrow diseases.  SYMPTOMS   Anemia can come on suddenly (acute). It can also come on slowly. Symptoms can include:   Minor weakness.   Dizziness.   Palpitations.   Shortness of breath.  Symptoms may be absent until half your hemoglobin is missing if it comes on slowly. Anemia due to acute blood loss from an injury or internal bleeding may require blood transfusion if the loss is severe. Hospital care is needed if you are anemic and there is significant continual blood loss.  TREATMENT    Stool tests for blood (Hemoccult) and additional lab tests are often needed. This determines the best treatment.   Further checking on your condition and your response to treatment is very important. It often takes many weeks to correct anemia.  Depending on the cause, treatment can include:   Supplements of iron.   Vitamins B12 and folic acid.   Hormone medicines.If your anemia is due to bleeding, finding the cause of the blood loss is very important. This will help avoid further problems.  SEEK IMMEDIATE MEDICAL CARE IF:    You develop fainting, extreme weakness, shortness of breath, or chest pain.   You develop heavy vaginal bleeding.   You develop bloody or black, tarry stools or vomit up blood.   You develop a high fever, rash, repeated vomiting, or dehydration.  Document Released: 09/14/2004 Document Revised: 10/30/2011 Document  Reviewed: 06/22/2009  [employer] Patient Information 2013 [employer].

## 2012-09-07 LAB — VITAMIN D 1,25 DIHYDROXY: Vitamin D2 1, 25 (OH)2: <8 pg/mL

## 2012-09-08 ENCOUNTER — Encounter: Payer: Self-pay | Admitting: Internal Medicine

## 2012-09-30 ENCOUNTER — Other Ambulatory Visit: Payer: Self-pay | Admitting: Internal Medicine

## 2013-12-04 ENCOUNTER — Telehealth: Payer: Self-pay

## 2013-12-04 DIAGNOSIS — I10 Essential (primary) hypertension: Secondary | ICD-10-CM

## 2013-12-04 MED ORDER — AMLODIPINE BESYLATE 10 MG PO TABS: 10.0000 mg | ORAL_TABLET | Freq: Every day | ORAL | Status: DC

## 2013-12-04 NOTE — Telephone Encounter (Signed)
rx approved

## 2013-12-04 NOTE — Telephone Encounter (Signed)
The patient has set up a cpe in May, but is in need of her blood pressure medication (Norvasc) in the next week.  Can she have a refill until her appointment. Thanks!   Patient call back - [phone]

## 2013-12-19 ENCOUNTER — Other Ambulatory Visit (HOSPITAL_COMMUNITY): Payer: Self-pay | Admitting: Obstetrics

## 2013-12-19 DIAGNOSIS — Z1231 Encounter for screening mammogram for malignant neoplasm of breast: Secondary | ICD-10-CM

## 2013-12-26 ENCOUNTER — Ambulatory Visit (HOSPITAL_COMMUNITY)
Admission: RE | Admit: 2013-12-26 | Discharge: 2013-12-26 | Disposition: A | Discharge status: Home / Self Care | Payer: BC Managed Care – PPO | Source: Ambulatory Visit | Attending: Obstetrics | Admitting: Obstetrics

## 2013-12-26 ENCOUNTER — Encounter: Payer: BC Managed Care – PPO | Admitting: Internal Medicine

## 2013-12-26 DIAGNOSIS — Z1231 Encounter for screening mammogram for malignant neoplasm of breast: Secondary | ICD-10-CM | POA: Insufficient documentation

## 2014-01-26 ENCOUNTER — Ambulatory Visit (INDEPENDENT_AMBULATORY_CARE_PROVIDER_SITE_OTHER): Payer: BC Managed Care – PPO | Admitting: Internal Medicine

## 2014-01-26 ENCOUNTER — Encounter: Payer: Self-pay | Admitting: Internal Medicine

## 2014-01-26 ENCOUNTER — Other Ambulatory Visit (INDEPENDENT_AMBULATORY_CARE_PROVIDER_SITE_OTHER): Payer: BC Managed Care – PPO

## 2014-01-26 VITALS — BP 138/86 | HR 84 | Temp 98.2°F | Resp 16 | Ht 67.0 in | Wt 152.0 lb

## 2014-01-26 DIAGNOSIS — Z0000 Encounter for general adult medical examination without abnormal findings: Secondary | ICD-10-CM

## 2014-01-26 DIAGNOSIS — D509 Iron deficiency anemia, unspecified: Secondary | ICD-10-CM

## 2014-01-26 DIAGNOSIS — Z23 Encounter for immunization: Secondary | ICD-10-CM

## 2014-01-26 DIAGNOSIS — I10 Essential (primary) hypertension: Secondary | ICD-10-CM

## 2014-01-26 DIAGNOSIS — M436 Torticollis: Secondary | ICD-10-CM | POA: Insufficient documentation

## 2014-01-26 LAB — COMPREHENSIVE METABOLIC PANEL
ALT: 10 U/L (ref 0–35)
AST: 16 U/L (ref 0–37)
Albumin: 4 g/dL (ref 3.5–5.2)
Alkaline Phosphatase: 46 U/L (ref 39–117)
BUN: 9 mg/dL (ref 6–23)
CO2: 30 meq/L (ref 19–32)
Calcium: 9.3 mg/dL (ref 8.4–10.5)
Chloride: 104 mEq/L (ref 96–112)
Creatinine, Ser: 0.8 mg/dL (ref 0.4–1.2)
GFR: 103.35 mL/min (ref >60.00)
Glucose, Bld: 74 mg/dL (ref 70–99)
POTASSIUM: 3.4 meq/L — AB (ref 3.5–5.1)
Sodium: 139 mEq/L (ref 135–145)
TOTAL PROTEIN: 7.5 g/dL (ref 6.0–8.3)
Total Bilirubin: 1.3 mg/dL — ABNORMAL HIGH (ref 0.2–1.2)

## 2014-01-26 LAB — FERRITIN: FERRITIN: 19.1 ng/mL (ref 10.0–291.0)

## 2014-01-26 LAB — LIPID PANEL
Cholesterol: 160 mg/dL (ref 0–200)
HDL: 47.5 mg/dL (ref >39.00)
LDL Cholesterol: 94 mg/dL (ref 0–99)
NONHDL: 112.5
Total CHOL/HDL Ratio: 3
Triglycerides: 93 mg/dL (ref 0.0–149.0)
VLDL: 18.6 mg/dL (ref 0.0–40.0)

## 2014-01-26 LAB — CBC WITH DIFFERENTIAL/PLATELET
Basophils Absolute: 0 10*3/uL (ref 0.0–0.1)
Basophils Relative: 0.5 % (ref 0.0–3.0)
EOS PCT: 1.6 % (ref 0.0–5.0)
Eosinophils Absolute: 0.1 10*3/uL (ref 0.0–0.7)
HEMATOCRIT: 33.6 % — AB (ref 36.0–46.0)
Hemoglobin: 11 g/dL — ABNORMAL LOW (ref 12.0–15.0)
Lymphocytes Relative: 30.7 % (ref 12.0–46.0)
Lymphs Abs: 1 10*3/uL (ref 0.7–4.0)
MCHC: 32.7 g/dL (ref 30.0–36.0)
MCV: 92 fl (ref 78.0–100.0)
MONOS PCT: 10.7 % (ref 3.0–12.0)
Monocytes Absolute: 0.4 10*3/uL (ref 0.1–1.0)
Neutro Abs: 1.9 10*3/uL (ref 1.4–7.7)
Neutrophils Relative %: 56.5 % (ref 43.0–77.0)
PLATELETS: 307 10*3/uL (ref 150.0–400.0)
RBC: 3.66 Mil/uL — ABNORMAL LOW (ref 3.87–5.11)
RDW: 19.6 % — ABNORMAL HIGH (ref 11.5–15.5)
WBC: 3.4 10*3/uL — AB (ref 4.0–10.5)

## 2014-01-26 LAB — IBC PANEL
Iron: 87 ug/dL (ref 42–145)
SATURATION RATIOS: 26.3 % (ref 20.0–50.0)
TRANSFERRIN: 236.3 mg/dL (ref 212.0–360.0)

## 2014-01-26 LAB — TSH: TSH: 1.2 u[IU]/mL (ref 0.35–4.50)

## 2014-01-26 MED ORDER — DICLOFENAC 18 MG PO CAPS: 1.0000 | ORAL_CAPSULE | Freq: Three times a day (TID) | ORAL | Status: DC | PRN

## 2014-01-26 NOTE — Assessment & Plan Note (Signed)
Her BP is adequately well controlled 

## 2014-01-26 NOTE — Assessment & Plan Note (Signed)
I will recheck her CBC and her iron level

## 2014-01-26 NOTE — Assessment & Plan Note (Signed)
She will start taking nsaids for the pain She was given pt ed material about this as well

## 2014-01-26 NOTE — Progress Notes (Signed)
Subjective:    Patient ID: Tabitha Matthews, female    DOB: 26-Dec-1970, 43 y.o.   MRN: [id number]  Neck Pain  This is a recurrent problem. The current episode started 1 to 4 weeks ago. The problem occurs intermittently. The problem has been unchanged. The pain is associated with nothing. The pain is present in the left side. The quality of the pain is described as stabbing. The pain is at a severity of 3/10. The pain is mild. The symptoms are aggravated by twisting. The pain is same all the time. Pertinent negatives include no chest pain, fever, headaches, numbness, pain with swallowing, paresis, photophobia, tingling, trouble swallowing, visual change or weakness. She has tried nothing for the symptoms. The treatment provided no relief.      Review of Systems  Constitutional: Negative.  Negative for fever.  HENT: Negative.  Negative for trouble swallowing.   Eyes: Negative.  Negative for photophobia.  Respiratory: Negative.   Cardiovascular: Negative.  Negative for chest pain, palpitations and leg swelling.  Gastrointestinal: Negative.  Negative for abdominal pain.  Endocrine: Negative.   Genitourinary: Negative.   Musculoskeletal: Positive for neck pain. Negative for arthralgias, back pain, gait problem, joint swelling, myalgias and neck stiffness.  Skin: Negative for rash.  Allergic/Immunologic: Negative.   Neurological: Negative.  Negative for dizziness, tingling, tremors, weakness, numbness and headaches.  Hematological: Negative.  Negative for adenopathy. Does not bruise/bleed easily.  Psychiatric/Behavioral: Negative.        Objective:   Physical Exam  Vitals reviewed. Constitutional: She is oriented to person, place, and time. She appears well-developed and well-nourished. No distress.  HENT:  Head: Normocephalic and atraumatic.  Mouth/Throat: Oropharynx is clear and moist. No oropharyngeal exudate.  Eyes: Conjunctivae are normal. Right eye exhibits no discharge. Left eye  exhibits no discharge. No scleral icterus.  Neck: Normal range of motion. Neck supple. No JVD present. No tracheal deviation present. No thyromegaly present.  Cardiovascular: Normal rate, regular rhythm, normal heart sounds and intact distal pulses.  Exam reveals no gallop and no friction rub.   No murmur heard. Pulmonary/Chest: Effort normal and breath sounds normal. No stridor. No respiratory distress. She has no wheezes. She has no rales. She exhibits no tenderness.  Abdominal: Soft. Bowel sounds are normal. She exhibits no distension and no mass. There is no tenderness. There is no rebound and no guarding.  Musculoskeletal: Normal range of motion. She exhibits no edema and no tenderness.       Cervical back: Normal. She exhibits normal range of motion, no tenderness, no bony tenderness, no swelling, no edema, no deformity, no laceration, no pain, no spasm and normal pulse.  Lymphadenopathy:    She has no cervical adenopathy.  Neurological: She is alert and oriented to person, place, and time. She has normal strength. She displays no atrophy, no tremor and normal reflexes. No cranial nerve deficit or sensory deficit. She exhibits normal muscle tone. She displays a negative Romberg sign. She displays no seizure activity. Coordination and gait normal. She displays no Babinski's sign on the left side.  Reflex Scores:      Tricep reflexes are 1+ on the right side and 1+ on the left side.      Bicep reflexes are 1+ on the right side and 1+ on the left side.      Brachioradialis reflexes are 1+ on the right side and 1+ on the left side.      Patellar reflexes are 1+ on the right  side and 1+ on the left side.      Achilles reflexes are 1+ on the right side and 1+ on the left side. Skin: Skin is warm and dry. No rash noted. She is not diaphoretic. No erythema. No pallor.  Psychiatric: She has a normal mood and affect. Her behavior is normal. Judgment and thought content normal.     Lab Results    Component Value Date   WBC 4.4* 09/03/2012   HGB 9.9* 09/03/2012   HCT 30.5* 09/03/2012   PLT 280.0 09/03/2012   GLUCOSE 96 09/03/2012   CHOL 179 09/03/2012   TRIG 50.0 09/03/2012   HDL 50.00 09/03/2012   LDLCALC 119* 09/03/2012   ALT 8 09/03/2012   AST 18 09/03/2012   NA 138 09/03/2012   K 3.9 09/03/2012   CL 107 09/03/2012   CREATININE 0.9 09/03/2012   BUN 9 09/03/2012   CO2 26 09/03/2012   TSH 1.48 09/03/2012       Assessment & Plan:

## 2014-01-26 NOTE — Patient Instructions (Signed)
Preventive Care for Adults, Female A healthy lifestyle and preventive care can promote health and wellness. Preventive health guidelines for women include the following key practices.  A routine yearly physical is a good way to check with your health care provider about your health and preventive screening. It is a chance to share any concerns and updates on your health and to receive a thorough exam.  Visit your dentist for a routine exam and preventive care every 6 months. Brush your teeth twice a day and floss once a day. Good oral hygiene prevents tooth decay and gum disease.  The frequency of eye exams is based on your age, health, family medical history, use of contact lenses, and other factors. Follow your health care provider's recommendations for frequency of eye exams.  Eat a healthy diet. Foods like vegetables, fruits, whole grains, low-fat dairy products, and lean protein foods contain the nutrients you need without too many calories. Decrease your intake of foods high in solid fats, added sugars, and salt. Eat the right amount of calories for you.Get information about a proper diet from your health care provider, if necessary.  Regular physical exercise is one of the most important things you can do for your health. Most adults should get at least 150 minutes of moderate-intensity exercise (any activity that increases your heart rate and causes you to sweat) each week. In addition, most adults need muscle-strengthening exercises on 2 or more days a week.  Maintain a healthy weight. The body mass index (BMI) is a screening tool to identify possible weight problems. It provides an estimate of body fat based on height and weight. Your health care provider can find your BMI, and can help you achieve or maintain a healthy weight.For adults 20 years and older:  A BMI below 18.5 is considered underweight.  A BMI of 18.5 to 24.9 is normal.  A BMI of 25 to 29.9 is considered overweight.  A  BMI of 30 and above is considered obese.  Maintain normal blood lipids and cholesterol levels by exercising and minimizing your intake of saturated fat. Eat a balanced diet with plenty of fruit and vegetables. Blood tests for lipids and cholesterol should begin at age 62 and be repeated every 5 years. If your lipid or cholesterol levels are high, you are over 50, or you are at high risk for heart disease, you may need your cholesterol levels checked more frequently.Ongoing high lipid and cholesterol levels should be treated with medicines if diet and exercise are not working.  If you smoke, find out from your health care provider how to quit. If you do not use tobacco, do not start.  Lung cancer screening is recommended for adults aged 36 80 years who are at high risk for developing lung cancer because of a history of smoking. A yearly low-dose CT scan of the lungs is recommended for people who have at least a 30-pack-year history of smoking and are a current smoker or have quit within the past 15 years. A pack year of smoking is smoking an average of 1 pack of cigarettes a day for 1 year (for example: 1 pack a day for 30 years or 2 packs a day for 15 years). Yearly screening should continue until the smoker has stopped smoking for at least 15 years. Yearly screening should be stopped for people who develop a health problem that would prevent them from having lung cancer treatment.  If you are pregnant, do not drink alcohol. If you  are breastfeeding, be very cautious about drinking alcohol. If you are not pregnant and choose to drink alcohol, do not have more than 1 drink per day. One drink is considered to be 12 ounces (355 mL) of beer, 5 ounces (148 mL) of wine, or 1.5 ounces (44 mL) of liquor.  Avoid use of street drugs. Do not share needles with anyone. Ask for help if you need support or instructions about stopping the use of drugs.  High blood pressure causes heart disease and increases the risk  of stroke. Your blood pressure should be checked at least every 1 to 2 years. Ongoing high blood pressure should be treated with medicines if weight loss and exercise do not work.  If you are 39 44 years old, ask your health care provider if you should take aspirin to prevent strokes.  Diabetes screening involves taking a blood sample to check your fasting blood sugar level. This should be done once every 3 years, after age 56, if you are within normal weight and without risk factors for diabetes. Testing should be considered at a younger age or be carried out more frequently if you are overweight and have at least 1 risk factor for diabetes.  Breast cancer screening is essential preventive care for women. You should practice "breast self-awareness." This means understanding the normal appearance and feel of your breasts and may include breast self-examination. Any changes detected, no matter how small, should be reported to a health care provider. Women in their 40s and 30s should have a clinical breast exam (CBE) by a health care provider as part of a regular health exam every 1 to 3 years. After age 28, women should have a CBE every year. Starting at age 72, women should consider having a mammogram (breast X-ray test) every year. Women who have a family history of breast cancer should talk to their health care provider about genetic screening. Women at a high risk of breast cancer should talk to their health care providers about having an MRI and a mammogram every year.  Breast cancer gene (BRCA)-related cancer risk assessment is recommended for women who have family members with BRCA-related cancers. BRCA-related cancers include breast, ovarian, tubal, and peritoneal cancers. Having family members with these cancers may be associated with an increased risk for harmful changes (mutations) in the breast cancer genes BRCA1 and BRCA2. Results of the assessment will determine the need for genetic counseling  and BRCA1 and BRCA2 testing.  The Pap test is a screening test for cervical cancer. A Pap test can show cell changes on the cervix that might become cervical cancer if left untreated. A Pap test is a procedure in which cells are obtained and examined from the lower end of the uterus (cervix).  Women should have a Pap test starting at age 59.  Between ages 42 and 13, Pap tests should be repeated every 2 years.  Beginning at age 53, you should have a Pap test every 3 years as long as the past 3 Pap tests have been normal.  Some women have medical problems that increase the chance of getting cervical cancer. Talk to your health care provider about these problems. It is especially important to talk to your health care provider if a new problem develops soon after your last Pap test. In these cases, your health care provider may recommend more frequent screening and Pap tests.  The above recommendations are the same for women who have or have not gotten the vaccine  for human papillomavirus (HPV).  If you had a hysterectomy for a problem that was not cancer or a condition that could lead to cancer, then you no longer need Pap tests. Even if you no longer need a Pap test, a regular exam is a good idea to make sure no other problems are starting.  If you are between ages 58 and 10 years, and you have had normal Pap tests going back 10 years, you no longer need Pap tests. Even if you no longer need a Pap test, a regular exam is a good idea to make sure no other problems are starting.  If you have had past treatment for cervical cancer or a condition that could lead to cancer, you need Pap tests and screening for cancer for at least 20 years after your treatment.  If Pap tests have been discontinued, risk factors (such as a new sexual partner) need to be reassessed to determine if screening should be resumed.  The HPV test is an additional test that may be used for cervical cancer screening. The HPV test  looks for the virus that can cause the cell changes on the cervix. The cells collected during the Pap test can be tested for HPV. The HPV test could be used to screen women aged 67 years and older, and should be used in women of any age who have unclear Pap test results. After the age of 65, women should have HPV testing at the same frequency as a Pap test.  Colorectal cancer can be detected and often prevented. Most routine colorectal cancer screening begins at the age of 25 years and continues through age 66 years. However, your health care provider may recommend screening at an earlier age if you have risk factors for colon cancer. On a yearly basis, your health care provider may provide home test kits to check for hidden blood in the stool. Use of a small camera at the end of a tube, to directly examine the colon (sigmoidoscopy or colonoscopy), can detect the earliest forms of colorectal cancer. Talk to your health care provider about this at age 79, when routine screening begins. Direct exam of the colon should be repeated every 5 10 years through age 47 years, unless early forms of pre-cancerous polyps or small growths are found.  People who are at an increased risk for hepatitis B should be screened for this virus. You are considered at high risk for hepatitis B if:  You were born in a country where hepatitis B occurs often. Talk with your health care provider about which countries are considered high risk.  Your parents were born in a high-risk country and you have not received a shot to protect against hepatitis B (hepatitis B vaccine).  You have HIV or AIDS.  You use needles to inject street drugs.  You live with, or have sex with, someone who has Hepatitis B.  You get hemodialysis treatment.  You take certain medicines for conditions like cancer, organ transplantation, and autoimmune conditions.  Hepatitis C blood testing is recommended for all people born from [birth year] through 1965 and  any individual with known risks for hepatitis C.  Practice safe sex. Use condoms and avoid high-risk sexual practices to reduce the spread of sexually transmitted infections (STIs). STIs include gonorrhea, chlamydia, syphilis, trichomonas, herpes, HPV, and human immunodeficiency virus (HIV). Herpes, HIV, and HPV are viral illnesses that have no cure. They can result in disability, cancer, and death. Sexually active women aged 66  years and younger should be checked for chlamydia. Older women with new or multiple partners should also be tested for chlamydia. Testing for other STIs is recommended if you are sexually active and at increased risk.  Osteoporosis is a disease in which the bones lose minerals and strength with aging. This can result in serious bone fractures or breaks. The risk of osteoporosis can be identified using a bone density scan. Women ages 18 years and over and women at risk for fractures or osteoporosis should discuss screening with their health care providers. Ask your health care provider whether you should take a calcium supplement or vitamin D to reduce the rate of osteoporosis.  Menopause can be associated with physical symptoms and risks. Hormone replacement therapy is available to decrease symptoms and risks. You should talk to your health care provider about whether hormone replacement therapy is right for you.  Use sunscreen. Apply sunscreen liberally and repeatedly throughout the day. You should seek shade when your shadow is shorter than you. Protect yourself by wearing long sleeves, pants, a wide-brimmed hat, and sunglasses year round, whenever you are outdoors.  Once a month, do a whole body skin exam, using a mirror to look at the skin on your back. Tell your health care provider of new moles, moles that have irregular borders, moles that are larger than a pencil eraser, or moles that have changed in shape or color.  Stay current with required vaccines  (immunizations).  Influenza vaccine. All adults should be immunized every year.  Tetanus, diphtheria, and acellular pertussis (Td, Tdap) vaccine. Pregnant women should receive 1 dose of Tdap vaccine during each pregnancy. The dose should be obtained regardless of the length of time since the last dose. Immunization is preferred during the 27th 36th week of gestation. An adult who has not previously received Tdap or who does not know her vaccine status should receive 1 dose of Tdap. This initial dose should be followed by tetanus and diphtheria toxoids (Td) booster doses every 10 years. Adults with an unknown or incomplete history of completing a 3-dose immunization series with Td-containing vaccines should begin or complete a primary immunization series including a Tdap dose. Adults should receive a Td booster every 10 years.  Varicella vaccine. An adult without evidence of immunity to varicella should receive 2 doses or a second dose if she has previously received 1 dose. Pregnant females who do not have evidence of immunity should receive the first dose after pregnancy. This first dose should be obtained before leaving the health care facility. The second dose should be obtained 4 8 weeks after the first dose.  Human papillomavirus (HPV) vaccine. Females aged 9 26 years who have not received the vaccine previously should obtain the 3-dose series. The vaccine is not recommended for use in pregnant females. However, pregnancy testing is not needed before receiving a dose. If a female is found to be pregnant after receiving a dose, no treatment is needed. In that case, the remaining doses should be delayed until after the pregnancy. Immunization is recommended for any person with an immunocompromised condition through the age of 51 years if she did not get any or all doses earlier. During the 3-dose series, the second dose should be obtained 4 8 weeks after the first dose. The third dose should be obtained  24 weeks after the first dose and 16 weeks after the second dose.  Zoster vaccine. One dose is recommended for adults aged 57 years or older unless certain  conditions are present.  Measles, mumps, and rubella (MMR) vaccine. Adults born before [birth year] generally are considered immune to measles and mumps. Adults born in [birth year] or later should have 1 or more doses of MMR vaccine unless there is a contraindication to the vaccine or there is laboratory evidence of immunity to each of the three diseases. A routine second dose of MMR vaccine should be obtained at least 28 days after the first dose for students attending postsecondary schools, health care workers, or international travelers. People who received inactivated measles vaccine or an unknown type of measles vaccine during 1963 1967 should receive 2 doses of MMR vaccine. People who received inactivated mumps vaccine or an unknown type of mumps vaccine before 1979 and are at high risk for mumps infection should consider immunization with 2 doses of MMR vaccine. For females of childbearing age, rubella immunity should be determined. If there is no evidence of immunity, females who are not pregnant should be vaccinated. If there is no evidence of immunity, females who are pregnant should delay immunization until after pregnancy. Unvaccinated health care workers born before [birth year] who lack laboratory evidence of measles, mumps, or rubella immunity or laboratory confirmation of disease should consider measles and mumps immunization with 2 doses of MMR vaccine or rubella immunization with 1 dose of MMR vaccine.  Pneumococcal 13-valent conjugate (PCV13) vaccine. When indicated, a person who is uncertain of her immunization history and has no record of immunization should receive the PCV13 vaccine. An adult aged 42 years or older who has certain medical conditions and has not been previously immunized should receive 1 dose of PCV13 vaccine. This PCV13 should be followed  with a dose of pneumococcal polysaccharide (PPSV23) vaccine. The PPSV23 vaccine dose should be obtained at least 8 weeks after the dose of PCV13 vaccine. An adult aged 4 years or older who has certain medical conditions and previously received 1 or more doses of PPSV23 vaccine should receive 1 dose of PCV13. The PCV13 vaccine dose should be obtained 1 or more years after the last PPSV23 vaccine dose.  Pneumococcal polysaccharide (PPSV23) vaccine. When PCV13 is also indicated, PCV13 should be obtained first. All adults aged 27 years and older should be immunized. An adult younger than age 33 years who has certain medical conditions should be immunized. Any person who resides in a nursing home or long-term care facility should be immunized. An adult smoker should be immunized. People with an immunocompromised condition and certain other conditions should receive both PCV13 and PPSV23 vaccines. People with human immunodeficiency virus (HIV) infection should be immunized as soon as possible after diagnosis. Immunization during chemotherapy or radiation therapy should be avoided. Routine use of PPSV23 vaccine is not recommended for American Indians, [place] Natives, or people younger than 65 years unless there are medical conditions that require PPSV23 vaccine. When indicated, people who have unknown immunization and have no record of immunization should receive PPSV23 vaccine. One-time revaccination 5 years after the first dose of PPSV23 is recommended for people aged 13 64 years who have chronic kidney failure, nephrotic syndrome, asplenia, or immunocompromised conditions. People who received 1 2 doses of PPSV23 before age 66 years should receive another dose of PPSV23 vaccine at age 27 years or later if at least 5 years have passed since the previous dose. Doses of PPSV23 are not needed for people immunized with PPSV23 at or after age 33 years.  Meningococcal vaccine. Adults with asplenia or persistent complement  component deficiencies should receive 2  doses of quadrivalent meningococcal conjugate (MenACWY-D) vaccine. The doses should be obtained at least 2 months apart. Microbiologists working with certain meningococcal bacteria, [place] recruits, people at risk during an outbreak, and people who travel to or live in countries with a high rate of meningitis should be immunized. A first-year college student up through age 49 years who is living in a residence hall should receive a dose if she did not receive a dose on or after her 16th birthday. Adults who have certain high-risk conditions should receive one or more doses of vaccine.  Hepatitis A vaccine. Adults who wish to be protected from this disease, have certain high-risk conditions, work with hepatitis A-infected animals, work in hepatitis A research labs, or travel to or work in countries with a high rate of hepatitis A should be immunized. Adults who were previously unvaccinated and who anticipate close contact with an international adoptee during the first 60 days after arrival in the [place] from a country with a high rate of hepatitis A should be immunized.  Hepatitis B vaccine. Adults who wish to be protected from this disease, have certain high-risk conditions, may be exposed to blood or other infectious body fluids, are household contacts or sex partners of hepatitis B positive people, are clients or workers in certain care facilities, or travel to or work in countries with a high rate of hepatitis B should be immunized.  Haemophilus influenzae type b (Hib) vaccine. A previously unvaccinated person with asplenia or sickle cell disease or having a scheduled splenectomy should receive 1 dose of Hib vaccine. Regardless of previous immunization, a recipient of a hematopoietic stem cell transplant should receive a 3-dose series 6 12 months after her successful transplant. Hib vaccine is not recommended for adults with HIV infection. Preventive  Services / Frequency Ages 24 to 39years  Blood pressure check.** / Every 1 to 2 years.  Lipid and cholesterol check.** / Every 5 years beginning at age 66.  Clinical breast exam.** / Every 3 years for women in their 12s and 24s.  BRCA-related cancer risk assessment.** / For women who have family members with a BRCA-related cancer (breast, ovarian, tubal, or peritoneal cancers).  Pap test.** / Every 2 years from ages 31 through 69. Every 3 years starting at age 64 through age 76 or 89 with a history of 3 consecutive normal Pap tests.  HPV screening.** / Every 3 years from ages 10 through ages 10 to 96 with a history of 3 consecutive normal Pap tests.  Hepatitis C blood test.** / For any individual with known risks for hepatitis C.  Skin self-exam. / Monthly.  Influenza vaccine. / Every year.  Tetanus, diphtheria, and acellular pertussis (Tdap, Td) vaccine.** / Consult your health care provider. Pregnant women should receive 1 dose of Tdap vaccine during each pregnancy. 1 dose of Td every 10 years.  Varicella vaccine.** / Consult your health care provider. Pregnant females who do not have evidence of immunity should receive the first dose after pregnancy.  HPV vaccine. / 3 doses over 6 months, if 90 and younger. The vaccine is not recommended for use in pregnant females. However, pregnancy testing is not needed before receiving a dose.  Measles, mumps, rubella (MMR) vaccine.** / You need at least 1 dose of MMR if you were born in 1957 or later. You may also need a 2nd dose. For females of childbearing age, rubella immunity should be determined. If there is no evidence of immunity, females who are not  pregnant should be vaccinated. If there is no evidence of immunity, females who are pregnant should delay immunization until after pregnancy.  Pneumococcal 13-valent conjugate (PCV13) vaccine.** / Consult your health care provider.  Pneumococcal polysaccharide (PPSV23) vaccine.** / 1 to 2  doses if you smoke cigarettes or if you have certain conditions.  Meningococcal vaccine.** / 1 dose if you are age 88 to 6 years and a Market researcher living in a residence hall, or have one of several medical conditions, you need to get vaccinated against meningococcal disease. You may also need additional booster doses.  Hepatitis A vaccine.** / Consult your health care provider.  Hepatitis B vaccine.** / Consult your health care provider.  Haemophilus influenzae type b (Hib) vaccine.** / Consult your health care provider. Ages 23 to 64years  Blood pressure check.** / Every 1 to 2 years.  Lipid and cholesterol check.** / Every 5 years beginning at age 20 years.  Lung cancer screening. / Every year if you are aged 51 80 years and have a 30-pack-year history of smoking and currently smoke or have quit within the past 15 years. Yearly screening is stopped once you have quit smoking for at least 15 years or develop a health problem that would prevent you from having lung cancer treatment.  Clinical breast exam.** / Every year after age 8 years.  BRCA-related cancer risk assessment.** / For women who have family members with a BRCA-related cancer (breast, ovarian, tubal, or peritoneal cancers).  Mammogram.** / Every year beginning at age 10 years and continuing for as long as you are in good health. Consult with your health care provider.  Pap test.** / Every 3 years starting at age 30 years through age 5 or 61 years with a history of 3 consecutive normal Pap tests.  HPV screening.** / Every 3 years from ages 39 years through ages 72 to 19 years with a history of 3 consecutive normal Pap tests.  Fecal occult blood test (FOBT) of stool. / Every year beginning at age 59 years and continuing until age 27 years. You may not need to do this test if you get a colonoscopy every 10 years.  Flexible sigmoidoscopy or colonoscopy.** / Every 5 years for a flexible sigmoidoscopy or every  10 years for a colonoscopy beginning at age 110 years and continuing until age 63 years.  Hepatitis C blood test.** / For all people born from [birth year] through 1966 and any individual with known risks for hepatitis C.  Skin self-exam. / Monthly.  Influenza vaccine. / Every year.  Tetanus, diphtheria, and acellular pertussis (Tdap/Td) vaccine.** / Consult your health care provider. Pregnant women should receive 1 dose of Tdap vaccine during each pregnancy. 1 dose of Td every 10 years.  Varicella vaccine.** / Consult your health care provider. Pregnant females who do not have evidence of immunity should receive the first dose after pregnancy.  Zoster vaccine.** / 1 dose for adults aged 46 years or older.  Measles, mumps, rubella (MMR) vaccine.** / You need at least 1 dose of MMR if you were born in 1958 or later. You may also need a 2nd dose. For females of childbearing age, rubella immunity should be determined. If there is no evidence of immunity, females who are not pregnant should be vaccinated. If there is no evidence of immunity, females who are pregnant should delay immunization until after pregnancy.  Pneumococcal 13-valent conjugate (PCV13) vaccine.** / Consult your health care provider.  Pneumococcal polysaccharide (PPSV23) vaccine.** / 1  to 2 doses if you smoke cigarettes or if you have certain conditions.  Meningococcal vaccine.** / Consult your health care provider.  Hepatitis A vaccine.** / Consult your health care provider.  Hepatitis B vaccine.** / Consult your health care provider.  Haemophilus influenzae type b (Hib) vaccine.** / Consult your health care provider. Ages 81 years and over  Blood pressure check.** / Every 1 to 2 years.  Lipid and cholesterol check.** / Every 5 years beginning at age 79 years.  Lung cancer screening. / Every year if you are aged 58 80 years and have a 30-pack-year history of smoking and currently smoke or have quit within the past 15 years.  Yearly screening is stopped once you have quit smoking for at least 15 years or develop a health problem that would prevent you from having lung cancer treatment.  Clinical breast exam.** / Every year after age 16 years.  BRCA-related cancer risk assessment.** / For women who have family members with a BRCA-related cancer (breast, ovarian, tubal, or peritoneal cancers).  Mammogram.** / Every year beginning at age 23 years and continuing for as long as you are in good health. Consult with your health care provider.  Pap test.** / Every 3 years starting at age 87 years through age 25 or 63 years with 3 consecutive normal Pap tests. Testing can be stopped between 65 and 70 years with 3 consecutive normal Pap tests and no abnormal Pap or HPV tests in the past 10 years.  HPV screening.** / Every 3 years from ages 66 years through ages 25 or 67 years with a history of 3 consecutive normal Pap tests. Testing can be stopped between 65 and 70 years with 3 consecutive normal Pap tests and no abnormal Pap or HPV tests in the past 10 years.  Fecal occult blood test (FOBT) of stool. / Every year beginning at age 32 years and continuing until age 19 years. You may not need to do this test if you get a colonoscopy every 10 years.  Flexible sigmoidoscopy or colonoscopy.** / Every 5 years for a flexible sigmoidoscopy or every 10 years for a colonoscopy beginning at age 52 years and continuing until age 69 years.  Hepatitis C blood test.** / For all people born from [birth year] through 1966 and any individual with known risks for hepatitis C.  Osteoporosis screening.** / A one-time screening for women ages 81 years and over and women at risk for fractures or osteoporosis.  Skin self-exam. / Monthly.  Influenza vaccine. / Every year.  Tetanus, diphtheria, and acellular pertussis (Tdap/Td) vaccine.** / 1 dose of Td every 10 years.  Varicella vaccine.** / Consult your health care provider.  Zoster vaccine.** / 1  dose for adults aged 57 years or older.  Pneumococcal 13-valent conjugate (PCV13) vaccine.** / Consult your health care provider.  Pneumococcal polysaccharide (PPSV23) vaccine.** / 1 dose for all adults aged 57 years and older.  Meningococcal vaccine.** / Consult your health care provider.  Hepatitis A vaccine.** / Consult your health care provider.  Hepatitis B vaccine.** / Consult your health care provider.  Haemophilus influenzae type b (Hib) vaccine.** / Consult your health care provider. ** Family history and personal history of risk and conditions may change your health care provider's recommendations. Document Released: 10/03/2001 Document Revised: 05/28/2013 Document Reviewed: 01/02/2011 [facility] Patient Information 2014 [place], [place]. Torticollis, Acute You have suddenly (acutely) developed a twisted neck (torticollis). This is usually a self-limited condition. CAUSES  Acute torticollis may be caused by  malposition, trauma or infection. Most commonly, acute torticollis is caused by sleeping in an awkward position. Torticollis may also be caused by the flexion, extension or twisting of the neck muscles beyond their normal position. Sometimes, the exact cause may not be known. SYMPTOMS  Usually, there is pain and limited movement of the neck. Your neck may twist to one side. DIAGNOSIS  The diagnosis is often made by physical examination. X-rays, CT scans or MRIs may be done if there is a history of trauma or concern of infection. TREATMENT  For a common, stiff neck that develops during sleep, treatment is focused on relaxing the contracted neck muscle. Medications (including shots) may be used to treat the problem. Most cases resolve in several days. Torticollis usually responds to conservative physical therapy. If left untreated, the shortened and spastic neck muscle can cause deformities in the face and neck. Rarely, surgery is required. HOME CARE INSTRUCTIONS   Use  over-the-counter and prescription medications as directed by your caregiver.  Do stretching exercises and massage the neck as directed by your caregiver.  Follow up with physical therapy if needed and as directed by your caregiver. SEEK IMMEDIATE MEDICAL CARE IF:   You develop difficulty breathing or noisy breathing (stridor).  You drool, develop trouble swallowing or have pain with swallowing.  You develop numbness or weakness in the hands or feet.  You have changes in speech or vision.  You have problems with urination or bowel movements.  You have difficulty walking.  You have a fever.  You have increased pain. MAKE SURE YOU:   Understand these instructions.  Will watch your condition.  Will get help right away if you are not doing well or get worse. Document Released: 08/04/2000 Document Revised: 10/30/2011 Document Reviewed: 09/15/2009 [facility] Patient Information 2014 [place], [place].

## 2014-01-26 NOTE — Progress Notes (Signed)
Pre visit review using our clinic review tool, if applicable. No additional management support is needed unless otherwise documented below in the visit note. 

## 2014-01-26 NOTE — Assessment & Plan Note (Signed)
Exam done Vaccines were reviewed and updated Labs ordered Pt ed material was given 

## 2014-05-05 ENCOUNTER — Other Ambulatory Visit: Payer: Self-pay | Admitting: Internal Medicine

## 2014-08-04 LAB — HM PAP SMEAR: HM Pap smear: NORMAL

## 2015-05-26 ENCOUNTER — Other Ambulatory Visit: Payer: Self-pay

## 2015-05-26 MED ORDER — AMLODIPINE BESYLATE 10 MG PO TABS: ORAL_TABLET | ORAL | Status: DC

## 2015-06-10 ENCOUNTER — Ambulatory Visit (INDEPENDENT_AMBULATORY_CARE_PROVIDER_SITE_OTHER): Payer: BC Managed Care – PPO | Admitting: Internal Medicine

## 2015-06-10 ENCOUNTER — Other Ambulatory Visit (INDEPENDENT_AMBULATORY_CARE_PROVIDER_SITE_OTHER): Payer: BC Managed Care – PPO

## 2015-06-10 ENCOUNTER — Other Ambulatory Visit: Payer: Self-pay

## 2015-06-10 ENCOUNTER — Encounter: Payer: Self-pay | Admitting: Internal Medicine

## 2015-06-10 VITALS — BP 136/84 | HR 84 | Temp 97.5°F | Resp 16 | Ht 67.0 in | Wt 155.0 lb

## 2015-06-10 DIAGNOSIS — Z1231 Encounter for screening mammogram for malignant neoplasm of breast: Secondary | ICD-10-CM

## 2015-06-10 DIAGNOSIS — I10 Essential (primary) hypertension: Secondary | ICD-10-CM | POA: Diagnosis not present

## 2015-06-10 DIAGNOSIS — Z0000 Encounter for general adult medical examination without abnormal findings: Secondary | ICD-10-CM | POA: Diagnosis not present

## 2015-06-10 DIAGNOSIS — D509 Iron deficiency anemia, unspecified: Secondary | ICD-10-CM

## 2015-06-10 LAB — CBC WITH DIFFERENTIAL/PLATELET
BASOS ABS: 0 10*3/uL (ref 0.0–0.1)
Basophils Relative: 0.8 % (ref 0.0–3.0)
Eosinophils Absolute: 0.1 10*3/uL (ref 0.0–0.7)
Eosinophils Relative: 1.6 % (ref 0.0–5.0)
HEMATOCRIT: 30.3 % — AB (ref 36.0–46.0)
HEMOGLOBIN: 9.7 g/dL — AB (ref 12.0–15.0)
LYMPHS PCT: 27.5 % (ref 12.0–46.0)
Lymphs Abs: 1.2 10*3/uL (ref 0.7–4.0)
MCHC: 32 g/dL (ref 30.0–36.0)
MCV: 84.8 fl (ref 78.0–100.0)
MONOS PCT: 10.3 % (ref 3.0–12.0)
Monocytes Absolute: 0.4 10*3/uL (ref 0.1–1.0)
NEUTROS ABS: 2.5 10*3/uL (ref 1.4–7.7)
Neutrophils Relative %: 59.8 % (ref 43.0–77.0)
PLATELETS: 415 10*3/uL — AB (ref 150.0–400.0)
RBC: 3.57 Mil/uL — ABNORMAL LOW (ref 3.87–5.11)
RDW: 17.1 % — ABNORMAL HIGH (ref 11.5–15.5)
WBC: 4.3 10*3/uL (ref 4.0–10.5)

## 2015-06-10 LAB — IBC PANEL
IRON: 68 ug/dL (ref 42–145)
Saturation Ratios: 14.5 % — ABNORMAL LOW (ref 20.0–50.0)
TRANSFERRIN: 334 mg/dL (ref 212.0–360.0)

## 2015-06-10 LAB — COMPREHENSIVE METABOLIC PANEL
ALBUMIN: 4.3 g/dL (ref 3.5–5.2)
ALT: 10 U/L (ref 0–35)
AST: 16 U/L (ref 0–37)
Alkaline Phosphatase: 55 U/L (ref 39–117)
BILIRUBIN TOTAL: 1.6 mg/dL — AB (ref 0.2–1.2)
BUN: 12 mg/dL (ref 6–23)
CALCIUM: 9.7 mg/dL (ref 8.4–10.5)
CO2: 29 meq/L (ref 19–32)
CREATININE: 0.89 mg/dL (ref 0.40–1.20)
Chloride: 103 mEq/L (ref 96–112)
GFR: 88.2 mL/min (ref >60.00)
Glucose, Bld: 98 mg/dL (ref 70–99)
Potassium: 3.6 mEq/L (ref 3.5–5.1)
Sodium: 139 mEq/L (ref 135–145)
TOTAL PROTEIN: 8.3 g/dL (ref 6.0–8.3)

## 2015-06-10 LAB — FERRITIN: Ferritin: 4.5 ng/mL — ABNORMAL LOW (ref 10.0–291.0)

## 2015-06-10 LAB — LIPID PANEL
CHOL/HDL RATIO: 3
CHOLESTEROL: 181 mg/dL (ref 0–200)
HDL: 56.6 mg/dL (ref >39.00)
LDL Cholesterol: 110 mg/dL — ABNORMAL HIGH (ref 0–99)
NonHDL: 124.08
TRIGLYCERIDES: 69 mg/dL (ref 0.0–149.0)
VLDL: 13.8 mg/dL (ref 0.0–40.0)

## 2015-06-10 LAB — TSH: TSH: 1.54 u[IU]/mL (ref 0.35–4.50)

## 2015-06-10 MED ORDER — AMLODIPINE BESYLATE 10 MG PO TABS: ORAL_TABLET | ORAL | Status: DC

## 2015-06-10 MED ORDER — FERREX 150 FORTE PLUS 50-100 MG PO CAPS: 1.0000 | ORAL_CAPSULE | Freq: Every day | ORAL | Status: DC

## 2015-06-10 NOTE — Progress Notes (Signed)
Subjective:  Patient ID: Tabitha Matthews, female    DOB: 1971-03-03  Age: 43 y.o. MRN: [id number]  CC: Annual Exam; Anemia; and Hypertension   HPI Ailana S Briles presents for a complete physical as well as follow-up on anemia. She complains of fatigue. She has not been taking any iron supplements. She has heavy periods with heavy bleeding for 3-4 days. She will soon see her gynecologist about having the heavy periods addressed. She reports that her blood pressures been well controlled.  Outpatient Prescriptions Prior to Visit  Medication Sig Dispense Refill  . PARAGARD INTRAUTERINE COPPER IU by Intrauterine route.    Marland Kitchen amLODipine (NORVASC) 10 MG tablet TAKE 1 TABLET (10 MG TOTAL) BY MOUTH DAILY. 30 tablet 0  . Diclofenac (ZORVOLEX) 18 MG CAPS Take 1 capsule by mouth 3 (three) times daily with meals as needed. 30 capsule 0  . ferrous sulfate 325 (65 FE) MG tablet Take 325 mg by mouth daily with breakfast.     No facility-administered medications prior to visit.    ROS Review of Systems  Constitutional: Positive for fatigue. Negative for fever, chills, diaphoresis, activity change, appetite change and unexpected weight change.  HENT: Negative.  Negative for congestion, facial swelling, nosebleeds, sinus pressure, sore throat and trouble swallowing.   Eyes: Negative.  Negative for visual disturbance.  Respiratory: Negative.  Negative for cough, choking, chest tightness, shortness of breath and stridor.   Cardiovascular: Negative.  Negative for chest pain, palpitations and leg swelling.  Gastrointestinal: Negative.  Negative for nausea, vomiting, abdominal pain, constipation, blood in stool and anal bleeding.  Endocrine: Negative.   Genitourinary: Positive for menstrual problem. Negative for dysuria, frequency, vaginal bleeding, vaginal discharge, difficulty urinating and vaginal pain.  Musculoskeletal: Negative.   Skin: Negative.  Negative for color change and pallor.    Allergic/Immunologic: Negative.   Neurological: Negative.  Negative for dizziness, tremors, syncope, light-headedness, numbness and headaches.  Hematological: Negative.  Negative for adenopathy. Does not bruise/bleed easily.  Psychiatric/Behavioral: Negative.     Objective:  BP 136/84 mmHg  Pulse 84  Temp(Src) 97.5 F (36.4 C) (Oral)  Resp 16  Ht 5\' 7"  (1.702 m)  Wt 155 lb (70.308 kg)  BMI 24.27 kg/m2  SpO2 99%  BP Readings from Last 3 Encounters:  06/10/15 136/84  01/26/14 138/86  09/03/12 120/78    Wt Readings from Last 3 Encounters:  06/10/15 155 lb (70.308 kg)  01/26/14 152 lb (68.947 kg)  09/03/12 151 lb 4 oz (68.607 kg)    Physical Exam  Constitutional: She is oriented to person, place, and time. She appears well-developed and well-nourished. No distress.  HENT:  Head: Normocephalic and atraumatic.  Mouth/Throat: Oropharynx is clear and moist. No oropharyngeal exudate.  Eyes: Conjunctivae are normal. Right eye exhibits no discharge. Left eye exhibits no discharge. No scleral icterus.  Neck: Normal range of motion. Neck supple. No JVD present. No tracheal deviation present. No thyromegaly present.  Cardiovascular: Normal rate, regular rhythm, normal heart sounds and intact distal pulses.  Exam reveals no gallop and no friction rub.   No murmur heard. Pulmonary/Chest: Effort normal and breath sounds normal. No stridor. No respiratory distress. She has no wheezes. She has no rales. She exhibits no tenderness.  Abdominal: Soft. Bowel sounds are normal. She exhibits no distension and no mass. There is no tenderness. There is no rebound and no guarding.  Musculoskeletal: Normal range of motion. She exhibits no edema or tenderness.  Lymphadenopathy:    She has  no cervical adenopathy.  Neurological: She is oriented to person, place, and time.  Skin: Skin is warm and dry. No rash noted. She is not diaphoretic. No erythema. No pallor.  Psychiatric: She has a normal mood and  affect. Her behavior is normal. Judgment and thought content normal.  Vitals reviewed.   Lab Results  Component Value Date   WBC 4.3 06/10/2015   HGB 9.7* 06/10/2015   HCT 30.3* 06/10/2015   PLT 415.0* 06/10/2015   GLUCOSE 98 06/10/2015   CHOL 181 06/10/2015   TRIG 69.0 06/10/2015   HDL 56.60 06/10/2015   LDLCALC 110* 06/10/2015   ALT 10 06/10/2015   AST 16 06/10/2015   NA 139 06/10/2015   K 3.6 06/10/2015   CL 103 06/10/2015   CREATININE 0.89 06/10/2015   BUN 12 06/10/2015   CO2 29 06/10/2015   TSH 1.54 06/10/2015    Mm Digital Screening Bilateral  12/29/2013  CLINICAL DATA:  Screening. EXAM: DIGITAL SCREENING BILATERAL MAMMOGRAM WITH CAD COMPARISON:  Previous exam(s). ACR Breast Density Category d: The breast tissue is extremely dense, which lowers the sensitivity of mammography. FINDINGS: There are no findings suspicious for malignancy. Images were processed with CAD. IMPRESSION: No mammographic evidence of malignancy. A result letter of this screening mammogram will be mailed directly to the patient. RECOMMENDATION: Screening mammogram in one year. (Code:SM-B-01Y) BI-RADS CATEGORY  1: Negative. Electronically Signed   By: Shon Hale M.D.   On: 12/29/2013 15:01    Assessment & Plan:   Krishana was seen today for annual exam, anemia and hypertension.  Diagnoses and all orders for this visit:  Anemia, iron deficiency- her anemia is worse and her ferritin level is low, will restart an iron supplement. She will see her gynecologist about the heavy menstrual bleeding. -     IBC panel; Future -     Ferritin; Future -     Fe-Succ Ac-C-Thre Ac-B12-FA (FERREX 150 FORTE PLUS) 50-100 MG CAPS; Take 1 capsule by mouth daily.  Routine general medical examination at a health care facility- her Pap smear is up-to-date, exam done, labs ordered and reviewed, vaccines reviewed and updated. She will have her annual mammogram done soon as well. -     Lipid panel; Future -     CBC with  Differential/Platelet; Future -     Comprehensive metabolic panel; Future -     TSH; Future  Essential hypertension- her blood pressure is well-controlled, electrolytes and renal function are stable. -     amLODipine (NORVASC) 10 MG tablet; TAKE 1 TABLET (10 MG TOTAL) BY MOUTH DAILY.  I have discontinued Ms. Hults's ferrous sulfate and Diclofenac. I am also having her start on FERREX 150 FORTE PLUS. Additionally, I am having her maintain her PARAGARD INTRAUTERINE COPPER IU and amLODipine.  Meds ordered this encounter  Medications  . amLODipine (NORVASC) 10 MG tablet    Sig: TAKE 1 TABLET (10 MG TOTAL) BY MOUTH DAILY.    Dispense:  90 tablet    Refill:  2  . Fe-Succ Ac-C-Thre Ac-B12-FA (FERREX 150 FORTE PLUS) 50-100 MG CAPS    Sig: Take 1 capsule by mouth daily.    Dispense:  30 each    Refill:  11     Follow-up: Return in about 6 months (around 12/09/2015).  Scarlette Calico, MD

## 2015-06-10 NOTE — Patient Instructions (Signed)
Preventive Care for Adults, Female A healthy lifestyle and preventive care can promote health and wellness. Preventive health guidelines for women include the following key practices.  A routine yearly physical is a good way to check with your health care provider about your health and preventive screening. It is a chance to share any concerns and updates on your health and to receive a thorough exam.  Visit your dentist for a routine exam and preventive care every 6 months. Brush your teeth twice a day and floss once a day. Good oral hygiene prevents tooth decay and gum disease.  The frequency of eye exams is based on your age, health, family medical history, use of contact lenses, and other factors. Follow your health care provider's recommendations for frequency of eye exams.  Eat a healthy diet. Foods like vegetables, fruits, whole grains, low-fat dairy products, and lean protein foods contain the nutrients you need without too many calories. Decrease your intake of foods high in solid fats, added sugars, and salt. Eat the right amount of calories for you.Get information about a proper diet from your health care provider, if necessary.  Regular physical exercise is one of the most important things you can do for your health. Most adults should get at least 150 minutes of moderate-intensity exercise (any activity that increases your heart rate and causes you to sweat) each week. In addition, most adults need muscle-strengthening exercises on 2 or more days a week.  Maintain a healthy weight. The body mass index (BMI) is a screening tool to identify possible weight problems. It provides an estimate of body fat based on height and weight. Your health care provider can find your BMI and can help you achieve or maintain a healthy weight.For adults 20 years and older:  A BMI below 18.5 is considered underweight.  A BMI of 18.5 to 24.9 is normal.  A BMI of 25 to 29.9 is considered overweight.  A  BMI of 30 and above is considered obese.  Maintain normal blood lipids and cholesterol levels by exercising and minimizing your intake of saturated fat. Eat a balanced diet with plenty of fruit and vegetables. Blood tests for lipids and cholesterol should begin at age 45 and be repeated every 5 years. If your lipid or cholesterol levels are high, you are over 50, or you are at high risk for heart disease, you may need your cholesterol levels checked more frequently.Ongoing high lipid and cholesterol levels should be treated with medicines if diet and exercise are not working.  If you smoke, find out from your health care provider how to quit. If you do not use tobacco, do not start.  Lung cancer screening is recommended for adults aged 45-80 years who are at high risk for developing lung cancer because of a history of smoking. A yearly low-dose CT scan of the lungs is recommended for people who have at least a 30-pack-year history of smoking and are a current smoker or have quit within the past 15 years. A pack year of smoking is smoking an average of 1 pack of cigarettes a day for 1 year (for example: 1 pack a day for 30 years or 2 packs a day for 15 years). Yearly screening should continue until the smoker has stopped smoking for at least 15 years. Yearly screening should be stopped for people who develop a health problem that would prevent them from having lung cancer treatment.  If you are pregnant, do not drink alcohol. If you are  breastfeeding, be very cautious about drinking alcohol. If you are not pregnant and choose to drink alcohol, do not have more than 1 drink per day. One drink is considered to be 12 ounces (355 mL) of beer, 5 ounces (148 mL) of wine, or 1.5 ounces (44 mL) of liquor.  Avoid use of street drugs. Do not share needles with anyone. Ask for help if you need support or instructions about stopping the use of drugs.  High blood pressure causes heart disease and increases the risk  of stroke. Your blood pressure should be checked at least every 1 to 2 years. Ongoing high blood pressure should be treated with medicines if weight loss and exercise do not work.  If you are 55-79 years old, ask your health care provider if you should take aspirin to prevent strokes.  Diabetes screening is done by taking a blood sample to check your blood glucose level after you have not eaten for a certain period of time (fasting). If you are not overweight and you do not have risk factors for diabetes, you should be screened once every 3 years starting at age 45. If you are overweight or obese and you are 40-70 years of age, you should be screened for diabetes every year as part of your cardiovascular risk assessment.  Breast cancer screening is essential preventive care for women. You should practice "breast self-awareness." This means understanding the normal appearance and feel of your breasts and may include breast self-examination. Any changes detected, no matter how small, should be reported to a health care provider. Women in their 20s and 30s should have a clinical breast exam (CBE) by a health care provider as part of a regular health exam every 1 to 3 years. After age 40, women should have a CBE every year. Starting at age 40, women should consider having a mammogram (breast X-ray test) every year. Women who have a family history of breast cancer should talk to their health care provider about genetic screening. Women at a high risk of breast cancer should talk to their health care providers about having an MRI and a mammogram every year.  Breast cancer gene (BRCA)-related cancer risk assessment is recommended for women who have family members with BRCA-related cancers. BRCA-related cancers include breast, ovarian, tubal, and peritoneal cancers. Having family members with these cancers may be associated with an increased risk for harmful changes (mutations) in the breast cancer genes BRCA1 and  BRCA2. Results of the assessment will determine the need for genetic counseling and BRCA1 and BRCA2 testing.  Your health care provider may recommend that you be screened regularly for cancer of the pelvic organs (ovaries, uterus, and vagina). This screening involves a pelvic examination, including checking for microscopic changes to the surface of your cervix (Pap test). You may be encouraged to have this screening done every 3 years, beginning at age 21.  For women ages 30-65, health care providers may recommend pelvic exams and Pap testing every 3 years, or they may recommend the Pap and pelvic exam, combined with testing for human papilloma virus (HPV), every 5 years. Some types of HPV increase your risk of cervical cancer. Testing for HPV may also be done on women of any age with unclear Pap test results.  Other health care providers may not recommend any screening for nonpregnant women who are considered low risk for pelvic cancer and who do not have symptoms. Ask your health care provider if a screening pelvic exam is right for   you.  If you have had past treatment for cervical cancer or a condition that could lead to cancer, you need Pap tests and screening for cancer for at least 20 years after your treatment. If Pap tests have been discontinued, your risk factors (such as having a new sexual partner) need to be reassessed to determine if screening should resume. Some women have medical problems that increase the chance of getting cervical cancer. In these cases, your health care provider may recommend more frequent screening and Pap tests.  Colorectal cancer can be detected and often prevented. Most routine colorectal cancer screening begins at the age of 50 years and continues through age 75 years. However, your health care provider may recommend screening at an earlier age if you have risk factors for colon cancer. On a yearly basis, your health care provider may provide home test kits to check  for hidden blood in the stool. Use of a small camera at the end of a tube, to directly examine the colon (sigmoidoscopy or colonoscopy), can detect the earliest forms of colorectal cancer. Talk to your health care provider about this at age 50, when routine screening begins. Direct exam of the colon should be repeated every 5-10 years through age 75 years, unless early forms of precancerous polyps or small growths are found.  People who are at an increased risk for hepatitis B should be screened for this virus. You are considered at high risk for hepatitis B if:  You were born in a country where hepatitis B occurs often. Talk with your health care provider about which countries are considered high risk.  Your parents were born in a high-risk country and you have not received a shot to protect against hepatitis B (hepatitis B vaccine).  You have HIV or AIDS.  You use needles to inject street drugs.  You live with, or have sex with, someone who has hepatitis B.  You get hemodialysis treatment.  You take certain medicines for conditions like cancer, organ transplantation, and autoimmune conditions.  Hepatitis C blood testing is recommended for all people born from 1945 through 1965 and any individual with known risks for hepatitis C.  Practice safe sex. Use condoms and avoid high-risk sexual practices to reduce the spread of sexually transmitted infections (STIs). STIs include gonorrhea, chlamydia, syphilis, trichomonas, herpes, HPV, and human immunodeficiency virus (HIV). Herpes, HIV, and HPV are viral illnesses that have no cure. They can result in disability, cancer, and death.  You should be screened for sexually transmitted illnesses (STIs) including gonorrhea and chlamydia if:  You are sexually active and are younger than 24 years.  You are older than 24 years and your health care provider tells you that you are at risk for this type of infection.  Your sexual activity has changed  since you were last screened and you are at an increased risk for chlamydia or gonorrhea. Ask your health care provider if you are at risk.  If you are at risk of being infected with HIV, it is recommended that you take a prescription medicine daily to prevent HIV infection. This is called preexposure prophylaxis (PrEP). You are considered at risk if:  You are sexually active and do not regularly use condoms or know the HIV status of your partner(s).  You take drugs by injection.  You are sexually active with a partner who has HIV.  Talk with your health care provider about whether you are at high risk of being infected with HIV. If   you choose to begin PrEP, you should first be tested for HIV. You should then be tested every 3 months for as long as you are taking PrEP.  Osteoporosis is a disease in which the bones lose minerals and strength with aging. This can result in serious bone fractures or breaks. The risk of osteoporosis can be identified using a bone density scan. Women ages 67 years and over and women at risk for fractures or osteoporosis should discuss screening with their health care providers. Ask your health care provider whether you should take a calcium supplement or vitamin D to reduce the rate of osteoporosis.  Menopause can be associated with physical symptoms and risks. Hormone replacement therapy is available to decrease symptoms and risks. You should talk to your health care provider about whether hormone replacement therapy is right for you.  Use sunscreen. Apply sunscreen liberally and repeatedly throughout the day. You should seek shade when your shadow is shorter than you. Protect yourself by wearing long sleeves, pants, a wide-brimmed hat, and sunglasses year round, whenever you are outdoors.  Once a month, do a whole body skin exam, using a mirror to look at the skin on your back. Tell your health care provider of new moles, moles that have irregular borders, moles that  are larger than a pencil eraser, or moles that have changed in shape or color.  Stay current with required vaccines (immunizations).  Influenza vaccine. All adults should be immunized every year.  Tetanus, diphtheria, and acellular pertussis (Td, Tdap) vaccine. Pregnant women should receive 1 dose of Tdap vaccine during each pregnancy. The dose should be obtained regardless of the length of time since the last dose. Immunization is preferred during the 27th-36th week of gestation. An adult who has not previously received Tdap or who does not know her vaccine status should receive 1 dose of Tdap. This initial dose should be followed by tetanus and diphtheria toxoids (Td) booster doses every 10 years. Adults with an unknown or incomplete history of completing a 3-dose immunization series with Td-containing vaccines should begin or complete a primary immunization series including a Tdap dose. Adults should receive a Td booster every 10 years.  Varicella vaccine. An adult without evidence of immunity to varicella should receive 2 doses or a second dose if she has previously received 1 dose. Pregnant females who do not have evidence of immunity should receive the first dose after pregnancy. This first dose should be obtained before leaving the health care facility. The second dose should be obtained 4-8 weeks after the first dose.  Human papillomavirus (HPV) vaccine. Females aged 13-26 years who have not received the vaccine previously should obtain the 3-dose series. The vaccine is not recommended for use in pregnant females. However, pregnancy testing is not needed before receiving a dose. If a female is found to be pregnant after receiving a dose, no treatment is needed. In that case, the remaining doses should be delayed until after the pregnancy. Immunization is recommended for any person with an immunocompromised condition through the age of 61 years if she did not get any or all doses earlier. During the  3-dose series, the second dose should be obtained 4-8 weeks after the first dose. The third dose should be obtained 24 weeks after the first dose and 16 weeks after the second dose.  Zoster vaccine. One dose is recommended for adults aged 30 years or older unless certain conditions are present.  Measles, mumps, and rubella (MMR) vaccine. Adults born  before 1957 generally are considered immune to measles and mumps. Adults born in 1957 or later should have 1 or more doses of MMR vaccine unless there is a contraindication to the vaccine or there is laboratory evidence of immunity to each of the three diseases. A routine second dose of MMR vaccine should be obtained at least 28 days after the first dose for students attending postsecondary schools, health care workers, or international travelers. People who received inactivated measles vaccine or an unknown type of measles vaccine during 1963-1967 should receive 2 doses of MMR vaccine. People who received inactivated mumps vaccine or an unknown type of mumps vaccine before 1979 and are at high risk for mumps infection should consider immunization with 2 doses of MMR vaccine. For females of childbearing age, rubella immunity should be determined. If there is no evidence of immunity, females who are not pregnant should be vaccinated. If there is no evidence of immunity, females who are pregnant should delay immunization until after pregnancy. Unvaccinated health care workers born before 1957 who lack laboratory evidence of measles, mumps, or rubella immunity or laboratory confirmation of disease should consider measles and mumps immunization with 2 doses of MMR vaccine or rubella immunization with 1 dose of MMR vaccine.  Pneumococcal 13-valent conjugate (PCV13) vaccine. When indicated, a person who is uncertain of his immunization history and has no record of immunization should receive the PCV13 vaccine. All adults 65 years of age and older should receive this  vaccine. An adult aged 19 years or older who has certain medical conditions and has not been previously immunized should receive 1 dose of PCV13 vaccine. This PCV13 should be followed with a dose of pneumococcal polysaccharide (PPSV23) vaccine. Adults who are at high risk for pneumococcal disease should obtain the PPSV23 vaccine at least 8 weeks after the dose of PCV13 vaccine. Adults older than 45 years of age who have normal immune system function should obtain the PPSV23 vaccine dose at least 1 year after the dose of PCV13 vaccine.  Pneumococcal polysaccharide (PPSV23) vaccine. When PCV13 is also indicated, PCV13 should be obtained first. All adults aged 65 years and older should be immunized. An adult younger than age 65 years who has certain medical conditions should be immunized. Any person who resides in a nursing home or long-term care facility should be immunized. An adult smoker should be immunized. People with an immunocompromised condition and certain other conditions should receive both PCV13 and PPSV23 vaccines. People with human immunodeficiency virus (HIV) infection should be immunized as soon as possible after diagnosis. Immunization during chemotherapy or radiation therapy should be avoided. Routine use of PPSV23 vaccine is not recommended for American Indians, [place] Natives, or people younger than 65 years unless there are medical conditions that require PPSV23 vaccine. When indicated, people who have unknown immunization and have no record of immunization should receive PPSV23 vaccine. One-time revaccination 5 years after the first dose of PPSV23 is recommended for people aged 19-64 years who have chronic kidney failure, nephrotic syndrome, asplenia, or immunocompromised conditions. People who received 1-2 doses of PPSV23 before age 65 years should receive another dose of PPSV23 vaccine at age 65 years or later if at least 5 years have passed since the previous dose. Doses of PPSV23 are not  needed for people immunized with PPSV23 at or after age 65 years.  Meningococcal vaccine. Adults with asplenia or persistent complement component deficiencies should receive 2 doses of quadrivalent meningococcal conjugate (MenACWY-D) vaccine. The doses should be obtained   at least 2 months apart. Microbiologists working with certain meningococcal bacteria, [place] recruits, people at risk during an outbreak, and people who travel to or live in countries with a high rate of meningitis should be immunized. A first-year college student up through age 34 years who is living in a residence hall should receive a dose if she did not receive a dose on or after her 16th birthday. Adults who have certain high-risk conditions should receive one or more doses of vaccine.  Hepatitis A vaccine. Adults who wish to be protected from this disease, have certain high-risk conditions, work with hepatitis A-infected animals, work in hepatitis A research labs, or travel to or work in countries with a high rate of hepatitis A should be immunized. Adults who were previously unvaccinated and who anticipate close contact with an international adoptee during the first 60 days after arrival in the [place] from a country with a high rate of hepatitis A should be immunized.  Hepatitis B vaccine. Adults who wish to be protected from this disease, have certain high-risk conditions, may be exposed to blood or other infectious body fluids, are household contacts or sex partners of hepatitis B positive people, are clients or workers in certain care facilities, or travel to or work in countries with a high rate of hepatitis B should be immunized.  Haemophilus influenzae type b (Hib) vaccine. A previously unvaccinated person with asplenia or sickle cell disease or having a scheduled splenectomy should receive 1 dose of Hib vaccine. Regardless of previous immunization, a recipient of a hematopoietic stem cell transplant should receive a  3-dose series 6-12 months after her successful transplant. Hib vaccine is not recommended for adults with HIV infection. Preventive Services / Frequency Ages 35 to 4 years  Blood pressure check.** / Every 3-5 years.  Lipid and cholesterol check.** / Every 5 years beginning at age 60.  Clinical breast exam.** / Every 3 years for women in their 71s and 10s.  BRCA-related cancer risk assessment.** / For women who have family members with a BRCA-related cancer (breast, ovarian, tubal, or peritoneal cancers).  Pap test.** / Every 2 years from ages 76 through 26. Every 3 years starting at age 61 through age 76 or 93 with a history of 3 consecutive normal Pap tests.  HPV screening.** / Every 3 years from ages 37 through ages 60 to 51 with a history of 3 consecutive normal Pap tests.  Hepatitis C blood test.** / For any individual with known risks for hepatitis C.  Skin self-exam. / Monthly.  Influenza vaccine. / Every year.  Tetanus, diphtheria, and acellular pertussis (Tdap, Td) vaccine.** / Consult your health care provider. Pregnant women should receive 1 dose of Tdap vaccine during each pregnancy. 1 dose of Td every 10 years.  Varicella vaccine.** / Consult your health care provider. Pregnant females who do not have evidence of immunity should receive the first dose after pregnancy.  HPV vaccine. / 3 doses over 6 months, if 93 and younger. The vaccine is not recommended for use in pregnant females. However, pregnancy testing is not needed before receiving a dose.  Measles, mumps, rubella (MMR) vaccine.** / You need at least 1 dose of MMR if you were born in 1957 or later. You may also need a 2nd dose. For females of childbearing age, rubella immunity should be determined. If there is no evidence of immunity, females who are not pregnant should be vaccinated. If there is no evidence of immunity, females who are  pregnant should delay immunization until after pregnancy.  Pneumococcal  13-valent conjugate (PCV13) vaccine.** / Consult your health care provider.  Pneumococcal polysaccharide (PPSV23) vaccine.** / 1 to 2 doses if you smoke cigarettes or if you have certain conditions.  Meningococcal vaccine.** / 1 dose if you are age 68 to 8 years and a Market researcher living in a residence hall, or have one of several medical conditions, you need to get vaccinated against meningococcal disease. You may also need additional booster doses.  Hepatitis A vaccine.** / Consult your health care provider.  Hepatitis B vaccine.** / Consult your health care provider.  Haemophilus influenzae type b (Hib) vaccine.** / Consult your health care provider. Ages 7 to 53 years  Blood pressure check.** / Every year.  Lipid and cholesterol check.** / Every 5 years beginning at age 25 years.  Lung cancer screening. / Every year if you are aged 11-80 years and have a 30-pack-year history of smoking and currently smoke or have quit within the past 15 years. Yearly screening is stopped once you have quit smoking for at least 15 years or develop a health problem that would prevent you from having lung cancer treatment.  Clinical breast exam.** / Every year after age 48 years.  BRCA-related cancer risk assessment.** / For women who have family members with a BRCA-related cancer (breast, ovarian, tubal, or peritoneal cancers).  Mammogram.** / Every year beginning at age 41 years and continuing for as long as you are in good health. Consult with your health care provider.  Pap test.** / Every 3 years starting at age 65 years through age 37 or 70 years with a history of 3 consecutive normal Pap tests.  HPV screening.** / Every 3 years from ages 72 years through ages 60 to 40 years with a history of 3 consecutive normal Pap tests.  Fecal occult blood test (FOBT) of stool. / Every year beginning at age 21 years and continuing until age 5 years. You may not need to do this test if you get  a colonoscopy every 10 years.  Flexible sigmoidoscopy or colonoscopy.** / Every 5 years for a flexible sigmoidoscopy or every 10 years for a colonoscopy beginning at age 35 years and continuing until age 48 years.  Hepatitis C blood test.** / For all people born from [birth year] through 1965 and any individual with known risks for hepatitis C.  Skin self-exam. / Monthly.  Influenza vaccine. / Every year.  Tetanus, diphtheria, and acellular pertussis (Tdap/Td) vaccine.** / Consult your health care provider. Pregnant women should receive 1 dose of Tdap vaccine during each pregnancy. 1 dose of Td every 10 years.  Varicella vaccine.** / Consult your health care provider. Pregnant females who do not have evidence of immunity should receive the first dose after pregnancy.  Zoster vaccine.** / 1 dose for adults aged 30 years or older.  Measles, mumps, rubella (MMR) vaccine.** / You need at least 1 dose of MMR if you were born in 1957 or later. You may also need a second dose. For females of childbearing age, rubella immunity should be determined. If there is no evidence of immunity, females who are not pregnant should be vaccinated. If there is no evidence of immunity, females who are pregnant should delay immunization until after pregnancy.  Pneumococcal 13-valent conjugate (PCV13) vaccine.** / Consult your health care provider.  Pneumococcal polysaccharide (PPSV23) vaccine.** / 1 to 2 doses if you smoke cigarettes or if you have certain conditions.  Meningococcal vaccine.** /  Consult your health care provider.  Hepatitis A vaccine.** / Consult your health care provider.  Hepatitis B vaccine.** / Consult your health care provider.  Haemophilus influenzae type b (Hib) vaccine.** / Consult your health care provider. Ages 64 years and over  Blood pressure check.** / Every year.  Lipid and cholesterol check.** / Every 5 years beginning at age 23 years.  Lung cancer screening. / Every year if you  are aged 16-80 years and have a 30-pack-year history of smoking and currently smoke or have quit within the past 15 years. Yearly screening is stopped once you have quit smoking for at least 15 years or develop a health problem that would prevent you from having lung cancer treatment.  Clinical breast exam.** / Every year after age 74 years.  BRCA-related cancer risk assessment.** / For women who have family members with a BRCA-related cancer (breast, ovarian, tubal, or peritoneal cancers).  Mammogram.** / Every year beginning at age 44 years and continuing for as long as you are in good health. Consult with your health care provider.  Pap test.** / Every 3 years starting at age 58 years through age 22 or 39 years with 3 consecutive normal Pap tests. Testing can be stopped between 65 and 70 years with 3 consecutive normal Pap tests and no abnormal Pap or HPV tests in the past 10 years.  HPV screening.** / Every 3 years from ages 64 years through ages 70 or 61 years with a history of 3 consecutive normal Pap tests. Testing can be stopped between 65 and 70 years with 3 consecutive normal Pap tests and no abnormal Pap or HPV tests in the past 10 years.  Fecal occult blood test (FOBT) of stool. / Every year beginning at age 40 years and continuing until age 27 years. You may not need to do this test if you get a colonoscopy every 10 years.  Flexible sigmoidoscopy or colonoscopy.** / Every 5 years for a flexible sigmoidoscopy or every 10 years for a colonoscopy beginning at age 7 years and continuing until age 32 years.  Hepatitis C blood test.** / For all people born from [birth year] through 1966 and any individual with known risks for hepatitis C.  Osteoporosis screening.** / A one-time screening for women ages 30 years and over and women at risk for fractures or osteoporosis.  Skin self-exam. / Monthly.  Influenza vaccine. / Every year.  Tetanus, diphtheria, and acellular pertussis (Tdap/Td)  vaccine.** / 1 dose of Td every 10 years.  Varicella vaccine.** / Consult your health care provider.  Zoster vaccine.** / 1 dose for adults aged 35 years or older.  Pneumococcal 13-valent conjugate (PCV13) vaccine.** / Consult your health care provider.  Pneumococcal polysaccharide (PPSV23) vaccine.** / 1 dose for all adults aged 46 years and older.  Meningococcal vaccine.** / Consult your health care provider.  Hepatitis A vaccine.** / Consult your health care provider.  Hepatitis B vaccine.** / Consult your health care provider.  Haemophilus influenzae type b (Hib) vaccine.** / Consult your health care provider. ** Family history and personal history of risk and conditions may change your health care provider's recommendations.   This information is not intended to replace advice given to you by your health care provider. Make sure you discuss any questions you have with your health care provider.   Document Released: 10/03/2001 Document Revised: 08/28/2014 Document Reviewed: 01/02/2011 Elsevier Interactive Patient Education [employer].

## 2015-06-10 NOTE — Progress Notes (Signed)
Pre visit review using our clinic review tool, if applicable. No additional management support is needed unless otherwise documented below in the visit note. 

## 2015-06-22 ENCOUNTER — Ambulatory Visit
Admission: RE | Admit: 2015-06-22 | Discharge: 2015-06-22 | Disposition: A | Discharge status: Home / Self Care | Payer: BC Managed Care – PPO | Source: Ambulatory Visit

## 2015-06-22 DIAGNOSIS — Z1231 Encounter for screening mammogram for malignant neoplasm of breast: Secondary | ICD-10-CM

## 2015-07-07 ENCOUNTER — Telehealth: Payer: Self-pay | Admitting: Internal Medicine

## 2015-07-07 MED ORDER — SCOPOLAMINE 1 MG/3DAYS TD PT72: 1.0000 | MEDICATED_PATCH | TRANSDERMAL | Status: DC

## 2015-07-07 NOTE — Telephone Encounter (Signed)
Patient states that she is leaving for a cruise this Saturday and she gets sea sick.  She is requesting something to be sent to [facility] on [address] for this.

## 2015-07-07 NOTE — Telephone Encounter (Signed)
done

## 2015-07-07 NOTE — Telephone Encounter (Signed)
Please advise 

## 2015-11-18 ENCOUNTER — Encounter: Payer: Self-pay | Admitting: Internal Medicine

## 2015-11-18 ENCOUNTER — Other Ambulatory Visit (INDEPENDENT_AMBULATORY_CARE_PROVIDER_SITE_OTHER): Payer: BC Managed Care – PPO

## 2015-11-18 ENCOUNTER — Ambulatory Visit (INDEPENDENT_AMBULATORY_CARE_PROVIDER_SITE_OTHER): Payer: BC Managed Care – PPO | Admitting: Internal Medicine

## 2015-11-18 ENCOUNTER — Ambulatory Visit (INDEPENDENT_AMBULATORY_CARE_PROVIDER_SITE_OTHER)
Admission: RE | Admit: 2015-11-18 | Discharge: 2015-11-18 | Disposition: A | Discharge status: Home / Self Care | Payer: BC Managed Care – PPO | Source: Ambulatory Visit | Attending: Internal Medicine | Admitting: Internal Medicine

## 2015-11-18 VITALS — BP 168/108 | HR 100 | Temp 97.9°F | Resp 16 | Ht 67.0 in | Wt 151.0 lb

## 2015-11-18 DIAGNOSIS — D509 Iron deficiency anemia, unspecified: Secondary | ICD-10-CM

## 2015-11-18 DIAGNOSIS — I10 Essential (primary) hypertension: Secondary | ICD-10-CM | POA: Diagnosis not present

## 2015-11-18 DIAGNOSIS — R1084 Generalized abdominal pain: Secondary | ICD-10-CM | POA: Diagnosis not present

## 2015-11-18 DIAGNOSIS — E559 Vitamin D deficiency, unspecified: Secondary | ICD-10-CM

## 2015-11-18 LAB — URINALYSIS, ROUTINE W REFLEX MICROSCOPIC
Bilirubin Urine: NEGATIVE
HGB URINE DIPSTICK: NEGATIVE
Ketones, ur: NEGATIVE
LEUKOCYTES UA: NEGATIVE
Nitrite: NEGATIVE
SPECIFIC GRAVITY, URINE: 1.01 (ref 1.000–1.030)
TOTAL PROTEIN, URINE-UPE24: NEGATIVE
UROBILINOGEN UA: 0.2 (ref 0.0–1.0)
Urine Glucose: NEGATIVE
pH: 7.5 (ref 5.0–8.0)

## 2015-11-18 LAB — CBC WITH DIFFERENTIAL/PLATELET
BASOS ABS: 0 10*3/uL (ref 0.0–0.1)
Basophils Relative: 0.5 % (ref 0.0–3.0)
EOS ABS: 0.1 10*3/uL (ref 0.0–0.7)
Eosinophils Relative: 1.2 % (ref 0.0–5.0)
HEMATOCRIT: 36.8 % (ref 36.0–46.0)
HEMOGLOBIN: 12.6 g/dL (ref 12.0–15.0)
LYMPHS PCT: 27.3 % (ref 12.0–46.0)
Lymphs Abs: 1.3 10*3/uL (ref 0.7–4.0)
MCHC: 34.3 g/dL (ref 30.0–36.0)
MCV: 94.8 fl (ref 78.0–100.0)
MONO ABS: 0.4 10*3/uL (ref 0.1–1.0)
Monocytes Relative: 8.7 % (ref 3.0–12.0)
Neutro Abs: 3.1 10*3/uL (ref 1.4–7.7)
Neutrophils Relative %: 62.3 % (ref 43.0–77.0)
Platelets: 334 10*3/uL (ref 150.0–400.0)
RBC: 3.88 Mil/uL (ref 3.87–5.11)
RDW: 13.1 % (ref 11.5–15.5)
WBC: 4.9 10*3/uL (ref 4.0–10.5)

## 2015-11-18 LAB — HEPATIC FUNCTION PANEL
ALBUMIN: 4.8 g/dL (ref 3.5–5.2)
ALK PHOS: 53 U/L (ref 39–117)
ALT: 9 U/L (ref 0–35)
AST: 15 U/L (ref 0–37)
Bilirubin, Direct: 0.3 mg/dL (ref 0.0–0.3)
Total Bilirubin: 1.6 mg/dL — ABNORMAL HIGH (ref 0.2–1.2)
Total Protein: 8.5 g/dL — ABNORMAL HIGH (ref 6.0–8.3)

## 2015-11-18 LAB — TSH: TSH: 1.03 u[IU]/mL (ref 0.35–4.50)

## 2015-11-18 LAB — BASIC METABOLIC PANEL
BUN: 8 mg/dL (ref 6–23)
CHLORIDE: 100 meq/L (ref 96–112)
CO2: 32 mEq/L (ref 19–32)
Calcium: 10.1 mg/dL (ref 8.4–10.5)
Creatinine, Ser: 0.82 mg/dL (ref 0.40–1.20)
GFR: 96.76 mL/min (ref >60.00)
Glucose, Bld: 100 mg/dL — ABNORMAL HIGH (ref 70–99)
POTASSIUM: 3.3 meq/L — AB (ref 3.5–5.1)
SODIUM: 140 meq/L (ref 135–145)

## 2015-11-18 LAB — IBC PANEL
Iron: 43 ug/dL (ref 42–145)
Saturation Ratios: 9.7 % — ABNORMAL LOW (ref 20.0–50.0)
TRANSFERRIN: 316 mg/dL (ref 212.0–360.0)

## 2015-11-18 LAB — HCG, QUANTITATIVE, PREGNANCY: Quantitative HCG: 0.14 m[IU]/mL

## 2015-11-18 LAB — AMYLASE: Amylase: 67 U/L (ref 27–131)

## 2015-11-18 LAB — LIPASE: LIPASE: 36 U/L (ref 11.0–59.0)

## 2015-11-18 LAB — FERRITIN: FERRITIN: 8.1 ng/mL — AB (ref 10.0–291.0)

## 2015-11-18 NOTE — Progress Notes (Signed)
Pre visit review using our clinic review tool, if applicable. No additional management support is needed unless otherwise documented below in the visit note. 

## 2015-11-18 NOTE — Patient Instructions (Signed)

## 2015-11-18 NOTE — Progress Notes (Addendum)
Subjective:  Patient ID: Tabitha Matthews, female    DOB: 1970/11/11  Age: 45 y.o. MRN: [id number]  CC: Abdominal Pain and Hypertension   HPI Tabitha Matthews presents for a one-month history of intermittent abdominal cramps with a rare episode of nausea and vomiting. She has also lost about 5 pounds. Her abdomen intermittently feels bloated. She thought the symptoms may be related to her iron pills or her blood pressure medicine, amlodipine, so she stopped taking those about a month ago but complains that the symptoms have not gotten much better. She has a history of uterine fibroids but his had no recent episodes of pelvic pain or vaginal bleeding. She had a normal menstrual cycle about 2 weeks ago.  Her blood pressure is not been well controlled and she has had a few episodes of shortness of breath.  Outpatient Prescriptions Prior to Visit  Medication Sig Dispense Refill  . PARAGARD INTRAUTERINE COPPER IU by Intrauterine route. Reported on 11/18/2015    . amLODipine (NORVASC) 10 MG tablet TAKE 1 TABLET (10 MG TOTAL) BY MOUTH DAILY. (Patient not taking: Reported on 11/18/2015) 90 tablet 2  . Fe-Succ Ac-C-Thre Ac-B12-FA (FERREX 150 FORTE PLUS) 50-100 MG CAPS Take 1 capsule by mouth daily. (Patient not taking: Reported on 11/18/2015) 30 each 11  . scopolamine (TRANSDERM-SCOP, 1.5 MG,) 1 MG/3DAYS Place 1 patch (1.5 mg total) onto the skin every 3 (three) days. (Patient not taking: Reported on 11/18/2015) 4 patch 0   No facility-administered medications prior to visit.    ROS Review of Systems  Constitutional: Negative.  Negative for fever, chills, diaphoresis, appetite change and fatigue.  HENT: Negative.  Negative for congestion, sinus pressure and trouble swallowing.   Eyes: Negative.   Respiratory: Positive for shortness of breath and wheezing. Negative for cough, choking, chest tightness and stridor.   Cardiovascular: Negative.  Negative for chest pain, palpitations and leg swelling.    Gastrointestinal: Positive for nausea, vomiting, abdominal pain and constipation. Negative for diarrhea, blood in stool, abdominal distention, anal bleeding and rectal pain.  Endocrine: Negative.   Genitourinary: Negative.  Negative for dysuria, urgency, frequency, hematuria, flank pain, vaginal bleeding, difficulty urinating and pelvic pain.  Musculoskeletal: Negative.  Negative for myalgias, back pain and neck pain.  Skin: Negative.  Negative for color change and rash.  Allergic/Immunologic: Negative.   Neurological: Negative.  Negative for dizziness, syncope, weakness and headaches.  Hematological: Negative.  Negative for adenopathy. Does not bruise/bleed easily.  Psychiatric/Behavioral: Negative.     Objective:  BP 168/108 mmHg  Pulse 100  Temp(Src) 97.9 F (36.6 C) (Oral)  Resp 16  Ht 5\' 7"  (1.702 m)  Wt 151 lb (68.493 kg)  BMI 23.64 kg/m2  SpO2 99%  LMP 11/02/2015  BP Readings from Last 3 Encounters:  11/18/15 168/108  06/10/15 136/84  01/26/14 138/86    Wt Readings from Last 3 Encounters:  11/18/15 151 lb (68.493 kg)  06/10/15 155 lb (70.308 kg)  01/26/14 152 lb (68.947 kg)    Physical Exam  Constitutional: She is oriented to person, place, and time. No distress.  HENT:  Mouth/Throat: Oropharynx is clear and moist. No oropharyngeal exudate.  Eyes: Conjunctivae are normal. Right eye exhibits no discharge. Left eye exhibits no discharge. No scleral icterus.  Neck: Normal range of motion. Neck supple. No JVD present. No tracheal deviation present. No thyromegaly present.  Cardiovascular: Normal rate, regular rhythm, normal heart sounds and intact distal pulses.  Exam reveals no gallop and no friction  rub.   No murmur heard. EKG -  NSR with slight LAE No LVH  Pulmonary/Chest: Effort normal and breath sounds normal. No stridor. No respiratory distress. She has no wheezes. She has no rales. She exhibits no tenderness.  Abdominal: Soft. Normal appearance and bowel  sounds are normal. She exhibits no distension and no mass. There is no hepatosplenomegaly. There is no tenderness. There is no rebound, no guarding and no CVA tenderness.  Musculoskeletal: Normal range of motion. She exhibits no edema or tenderness.  Lymphadenopathy:    She has no cervical adenopathy.  Neurological: She is oriented to person, place, and time.  Skin: Skin is warm and dry. No rash noted. She is not diaphoretic. No erythema. No pallor.  Psychiatric: She has a normal mood and affect. Her behavior is normal. Judgment and thought content normal.  Vitals reviewed.   Lab Results  Component Value Date   WBC 4.9 11/18/2015   HGB 12.6 11/18/2015   HCT 36.8 11/18/2015   PLT 334.0 11/18/2015   GLUCOSE 100* 11/18/2015   CHOL 181 06/10/2015   TRIG 69.0 06/10/2015   HDL 56.60 06/10/2015   LDLCALC 110* 06/10/2015   ALT 9 11/18/2015   AST 15 11/18/2015   NA 140 11/18/2015   K 3.3* 11/18/2015   CL 100 11/18/2015   CREATININE 0.82 11/18/2015   BUN 8 11/18/2015   CO2 32 11/18/2015   TSH 1.03 11/18/2015    Mm Digital Screening Bilateral  06/23/2015  CLINICAL DATA:  Screening. EXAM: DIGITAL SCREENING BILATERAL MAMMOGRAM WITH CAD COMPARISON:  Previous exam(s). ACR Breast Density Category d: The breast tissue is extremely dense, which lowers the sensitivity of mammography. FINDINGS: There are no findings suspicious for malignancy. Images were processed with CAD. IMPRESSION: No mammographic evidence of malignancy. A result letter of this screening mammogram will be mailed directly to the patient. RECOMMENDATION: Screening mammogram in one year. (Code:SM-B-01Y) BI-RADS CATEGORY  1: Negative. Electronically Signed   By: Altamese Cabal M.D.   On: 06/23/2015 16:28    Assessment & Plan:   Tabitha Matthews was seen today for abdominal pain and hypertension.  Diagnoses and all orders for this visit:  Essential hypertension- her blood pressure is not well controlled but she does not have any  symptoms related to this, I've asked her to restart amlodipine and we'll recheck her blood pressure in 3-4 weeks. -     Basic metabolic panel; Future -     EKG 12-Lead -     TSH; Future  Anemia, iron deficiency- improvement noted, she has decided not to take iron supplements for the next few months. -     IBC panel; Future -     CBC with Differential/Platelet; Future -     Ferritin; Future  Vitamin D deficiency  Generalized abdominal pain- her abdominal exam does not reveal an acute process, her labs do not indicate any concern for pancreatitis, renal stone, urinary tract infection, renal insufficiency or other metabolic causes. Her plain x-ray does show mild constipation which may be causing her symptoms, I will treat this if that is her desire. -     Lipase; Future -     Urinalysis, Routine w reflex microscopic (not at [facility]); Future -     Hepatic function panel; Future -     Amylase; Future -     DG Abd Acute W/Chest; Future -     hCG, quantitative, pregnancy; Future  I have discontinued Tabitha Matthews's scopolamine. I am also having her  maintain her PARAGARD INTRAUTERINE COPPER IU, amLODipine, and FERREX 150 FORTE PLUS.  No orders of the defined types were placed in this encounter.     Follow-up: Return in about 3 weeks (around 12/09/2015).  Scarlette Calico, MD

## 2015-11-19 ENCOUNTER — Other Ambulatory Visit: Payer: Self-pay | Admitting: Internal Medicine

## 2015-11-19 NOTE — Addendum Note (Signed)
Addended by: Janith Lima on: 11/19/2015 07:38 PM   Modules accepted: Miquel Dunn

## 2015-11-23 ENCOUNTER — Other Ambulatory Visit: Payer: Self-pay | Admitting: Internal Medicine

## 2015-11-23 DIAGNOSIS — K5900 Constipation, unspecified: Secondary | ICD-10-CM | POA: Insufficient documentation

## 2015-11-23 DIAGNOSIS — K5909 Other constipation: Secondary | ICD-10-CM

## 2015-11-23 MED ORDER — LUBIPROSTONE 24 MCG PO CAPS: 24.0000 ug | ORAL_CAPSULE | Freq: Two times a day (BID) | ORAL | Status: DC

## 2015-11-25 LAB — HM PAP SMEAR

## 2016-01-11 ENCOUNTER — Other Ambulatory Visit: Payer: Self-pay | Admitting: Internal Medicine

## 2016-01-26 LAB — CYTOLOGY - PAP: Pap: NEGATIVE

## 2016-01-26 LAB — HM MAMMOGRAPHY: HM MAMMO: NORMAL (ref 0–4)

## 2016-06-28 ENCOUNTER — Other Ambulatory Visit: Payer: Self-pay | Admitting: Internal Medicine

## 2016-06-28 ENCOUNTER — Telehealth: Payer: Self-pay | Admitting: Emergency Medicine

## 2016-06-28 MED ORDER — SCOPOLAMINE 1 MG/3DAYS TD PT72
1.0000 | MEDICATED_PATCH | TRANSDERMAL | 0 refills | Status: DC
Start: 2016-06-28 — End: 2016-12-20

## 2016-06-28 NOTE — Telephone Encounter (Signed)
Please advise at your convenience 

## 2016-06-28 NOTE — Telephone Encounter (Signed)
done

## 2016-06-28 NOTE — Telephone Encounter (Signed)
Pt called and is going on a cruise. She wants to know if she can get something for motion sickness. Please let her know if this is called in. Thanks.

## 2016-06-29 NOTE — Telephone Encounter (Signed)
Left detailed message for patient.

## 2016-08-03 ENCOUNTER — Other Ambulatory Visit: Payer: Self-pay | Admitting: Internal Medicine

## 2016-11-11 ENCOUNTER — Other Ambulatory Visit: Payer: Self-pay | Admitting: Internal Medicine

## 2016-11-18 ENCOUNTER — Other Ambulatory Visit: Payer: Self-pay | Admitting: Internal Medicine

## 2016-12-20 ENCOUNTER — Other Ambulatory Visit (INDEPENDENT_AMBULATORY_CARE_PROVIDER_SITE_OTHER): Payer: BC Managed Care – PPO

## 2016-12-20 ENCOUNTER — Ambulatory Visit (INDEPENDENT_AMBULATORY_CARE_PROVIDER_SITE_OTHER): Payer: BC Managed Care – PPO | Admitting: Internal Medicine

## 2016-12-20 ENCOUNTER — Encounter: Payer: Self-pay | Admitting: Internal Medicine

## 2016-12-20 VITALS — BP 136/86 | HR 90 | Temp 98.1°F | Resp 16 | Ht 67.0 in | Wt 162.5 lb

## 2016-12-20 DIAGNOSIS — R739 Hyperglycemia, unspecified: Secondary | ICD-10-CM

## 2016-12-20 DIAGNOSIS — D508 Other iron deficiency anemias: Secondary | ICD-10-CM

## 2016-12-20 DIAGNOSIS — Z0000 Encounter for general adult medical examination without abnormal findings: Secondary | ICD-10-CM

## 2016-12-20 DIAGNOSIS — I10 Essential (primary) hypertension: Secondary | ICD-10-CM

## 2016-12-20 DIAGNOSIS — Z111 Encounter for screening for respiratory tuberculosis: Secondary | ICD-10-CM

## 2016-12-20 LAB — LIPID PANEL
Cholesterol: 173 mg/dL (ref 0–200)
HDL: 49.8 mg/dL (ref >39.00)
LDL Cholesterol: 97 mg/dL (ref 0–99)
NonHDL: 123.43
TRIGLYCERIDES: 134 mg/dL (ref 0.0–149.0)
Total CHOL/HDL Ratio: 3
VLDL: 26.8 mg/dL (ref 0.0–40.0)

## 2016-12-20 LAB — CBC WITH DIFFERENTIAL/PLATELET
BASOS ABS: 0.1 10*3/uL (ref 0.0–0.1)
Basophils Relative: 1.3 % (ref 0.0–3.0)
Eosinophils Absolute: 0.1 10*3/uL (ref 0.0–0.7)
Eosinophils Relative: 0.9 % (ref 0.0–5.0)
HCT: 23.1 % — CL (ref 36.0–46.0)
Hemoglobin: 7.3 g/dL — CL (ref 12.0–15.0)
LYMPHS ABS: 1.1 10*3/uL (ref 0.7–4.0)
LYMPHS PCT: 20.3 % (ref 12.0–46.0)
MCHC: 31.6 g/dL (ref 30.0–36.0)
MCV: 73.5 fl — AB (ref 78.0–100.0)
MONOS PCT: 10.4 % (ref 3.0–12.0)
Monocytes Absolute: 0.6 10*3/uL (ref 0.1–1.0)
NEUTROS PCT: 67.1 % (ref 43.0–77.0)
Neutro Abs: 3.8 10*3/uL (ref 1.4–7.7)
Platelets: 421 10*3/uL — ABNORMAL HIGH (ref 150.0–400.0)
RBC: 3.15 Mil/uL — AB (ref 3.87–5.11)
RDW: 18 % — ABNORMAL HIGH (ref 11.5–15.5)
WBC: 5.6 10*3/uL (ref 4.0–10.5)

## 2016-12-20 LAB — COMPREHENSIVE METABOLIC PANEL
ALBUMIN: 4.4 g/dL (ref 3.5–5.2)
ALK PHOS: 49 U/L (ref 39–117)
ALT: 7 U/L (ref 0–35)
AST: 12 U/L (ref 0–37)
BILIRUBIN TOTAL: 1 mg/dL (ref 0.2–1.2)
BUN: 11 mg/dL (ref 6–23)
CALCIUM: 9.5 mg/dL (ref 8.4–10.5)
CO2: 28 mEq/L (ref 19–32)
Chloride: 103 mEq/L (ref 96–112)
Creatinine, Ser: 0.92 mg/dL (ref 0.40–1.20)
GFR: 84.32 mL/min (ref >60.00)
Glucose, Bld: 95 mg/dL (ref 70–99)
Potassium: 3.5 mEq/L (ref 3.5–5.1)
Sodium: 138 mEq/L (ref 135–145)
TOTAL PROTEIN: 8.3 g/dL (ref 6.0–8.3)

## 2016-12-20 LAB — HEMOGLOBIN A1C: Hgb A1c MFr Bld: 5.7 % (ref 4.6–6.5)

## 2016-12-20 LAB — TSH: TSH: 1.41 u[IU]/mL (ref 0.35–4.50)

## 2016-12-20 MED ORDER — IRON POLYSACCH CMPLX-B12-FA 150-0.025-1 MG PO CAPS: 1.0000 | ORAL_CAPSULE | Freq: Every day | ORAL | 11 refills | Status: DC

## 2016-12-20 NOTE — Progress Notes (Signed)
Subjective:  Patient ID: Tabitha Matthews, female    DOB: Apr 22, 1971  Age: 46 y.o. MRN: [id number]  CC: Annual Exam; Anemia; and Hypertension   HPI Tabitha Matthews presents for a CPX.  She complains of fatigue, SOB, and long heavy menstrual cycles. She has a hx of iron-deficiency anemia but stopped taking her iron supplement because she did not like the taste.  Her BP has been well controlled.  Outpatient Medications Prior to Visit  Medication Sig Dispense Refill  . amLODipine (NORVASC) 10 MG tablet TAKE 1 TABLET BY MOUTH DAILY 90 tablet 0  . PARAGARD INTRAUTERINE COPPER IU by Intrauterine route. Reported on 11/18/2015    . Fe-Succ Ac-C-Thre Ac-B12-FA (FERREX 150 FORTE PLUS) 50-100 MG CAPS Take 1 capsule by mouth daily. 30 each 11  . lubiprostone (AMITIZA) 24 MCG capsule Take 1 capsule (24 mcg total) by mouth 2 (two) times daily with a meal. 180 capsule 1  . scopolamine (TRANSDERM-SCOP, 1.5 MG,) 1 MG/3DAYS Place 1 patch (1.5 mg total) onto the skin every 3 (three) days. (Patient not taking: Reported on 12/20/2016) 4 patch 0   No facility-administered medications prior to visit.     ROS Review of Systems  Constitutional: Positive for fatigue. Negative for appetite change, chills, fever and unexpected weight change.  HENT: Negative for sinus pressure and trouble swallowing.   Eyes: Negative.  Negative for visual disturbance.  Respiratory: Positive for shortness of breath. Negative for choking, chest tightness and wheezing.   Cardiovascular: Negative for chest pain, palpitations and leg swelling.  Gastrointestinal: Negative for abdominal pain, blood in stool, diarrhea, nausea and vomiting.  Endocrine: Negative.   Genitourinary: Positive for menstrual problem and vaginal bleeding. Negative for difficulty urinating.  Musculoskeletal: Negative.  Negative for back pain and myalgias.  Skin: Negative.  Negative for color change and rash.  Neurological: Negative.  Negative for dizziness,  weakness, numbness and headaches.  Hematological: Negative.  Negative for adenopathy. Does not bruise/bleed easily.  Psychiatric/Behavioral: Negative.     Objective:  BP 136/86 (BP Location: Left Arm, Patient Position: Sitting, Cuff Size: Normal)   Pulse 90   Temp 98.1 F (36.7 C) (Oral)   Resp 16   Ht 5\' 7"  (1.702 m)   Wt 162 lb 8 oz (73.7 kg)   LMP 12/06/2016   SpO2 100%   BMI 25.45 kg/m   BP Readings from Last 3 Encounters:  12/20/16 136/86  11/18/15 (!) 168/108  06/10/15 136/84    Wt Readings from Last 3 Encounters:  12/20/16 162 lb 8 oz (73.7 kg)  11/18/15 151 lb (68.5 kg)  06/10/15 155 lb (70.3 kg)    Physical Exam  Constitutional: She is oriented to person, place, and time. No distress.  HENT:  Mouth/Throat: Oropharynx is clear and moist. Mucous membranes are pale, not dry and not cyanotic. No oropharyngeal exudate.  Eyes: Conjunctivae are normal. Right eye exhibits no discharge. Left eye exhibits no discharge. No scleral icterus.  Neck: Normal range of motion. Neck supple. No JVD present. No tracheal deviation present. No thyromegaly present.  Cardiovascular: Normal rate, regular rhythm, normal heart sounds and intact distal pulses.  Exam reveals no gallop and no friction rub.   No murmur heard. Pulmonary/Chest: Effort normal and breath sounds normal. No stridor. No respiratory distress. She has no wheezes. She has no rales. She exhibits no tenderness.  Abdominal: Soft. Bowel sounds are normal. She exhibits no distension and no mass. There is no tenderness. There is no rebound and  no guarding.  Musculoskeletal: Normal range of motion. She exhibits no edema, tenderness or deformity.  Lymphadenopathy:    She has no cervical adenopathy.  Neurological: She is oriented to person, place, and time.  Skin: Skin is warm and dry. No rash noted. She is not diaphoretic. No erythema. No pallor.  Vitals reviewed.   Lab Results  Component Value Date   WBC 5.6 12/20/2016    HGB 7.3 Repeated and verified X2. (LL) 12/20/2016   HCT 23.1 Repeated and verified X2. (LL) 12/20/2016   PLT 421.0 (H) 12/20/2016   GLUCOSE 95 12/20/2016   CHOL 173 12/20/2016   TRIG 134.0 12/20/2016   HDL 49.80 12/20/2016   LDLCALC 97 12/20/2016   ALT 7 12/20/2016   AST 12 12/20/2016   NA 138 12/20/2016   K 3.5 12/20/2016   CL 103 12/20/2016   CREATININE 0.92 12/20/2016   BUN 11 12/20/2016   CO2 28 12/20/2016   TSH 1.41 12/20/2016   HGBA1C 5.7 12/20/2016    Dg Abd Acute W/chest  Result Date: 11/19/2015 CLINICAL DATA:  Mid abdominal pain for 3 weeks. EXAM: DG ABDOMEN ACUTE W/ 1V CHEST COMPARISON:  Abdominal CT 11/27/2008. FINDINGS: The heart size and mediastinal contours are normal. The lungs are clear. There is no pleural effusion or pneumothorax. No acute osseous findings are identified. The bowel gas pattern is nonobstructive. The colonic stool burden may be slightly increased. No suspicious abdominal calcifications seen. Intrauterine device and mild thoracolumbar scoliosis noted. IMPRESSION: No active cardiopulmonary or abdominal process demonstrated. Possible mild constipation. Electronically Signed   By: Richardean Sale M.D.   On: 11/19/2015 08:09    Assessment & Plan:   Tabitha Matthews was seen today for annual exam, anemia and hypertension.  Diagnoses and all orders for this visit:  Essential hypertension- her blood pressure is well-controlled, electrolytes and renal function are normal. -     Comprehensive metabolic panel; Future -     CBC with Differential/Platelet; Future -     TSH; Future  Other iron deficiency anemia- Her hemoglobin is down to 7.3 and her hematocrit is down to 23.1, I've asked her to restart iron replacement therapy and to discuss her heavy menstrual cycles with her gynecologist. -     Iron Polysacch Cmplx-B12-FA 150-0.025-1 MG CAPS; Take 1 capsule by mouth daily.  Routine general medical examination at a health care facility- exam completed, labs  reviewed, vaccines reviewed, her Pap and mammogram are up-to-date, patient education material was given. -     Lipid panel; Future  Hyperglycemia- her A1c is 5.7% which is mildly prediabetic, she agrees to work on her lifestyle modifications to lower her blood sugars. -     Comprehensive metabolic panel; Future -     Hemoglobin A1c; Future  Screening-pulmonary TB -     PPD   I have discontinued Ms. Pelcher's FERREX 150 FORTE PLUS, lubiprostone, and scopolamine. I am also having her start on Iron Polysacch Cmplx-B12-FA. Additionally, I am having her maintain her PARAGARD INTRAUTERINE COPPER IU, amLODipine, and escitalopram.  Meds ordered this encounter  Medications  . escitalopram (LEXAPRO) 20 MG tablet  . Iron Polysacch Cmplx-B12-FA 150-0.025-1 MG CAPS    Sig: Take 1 capsule by mouth daily.    Dispense:  30 each    Refill:  11     Follow-up: Return in about 6 months (around 06/22/2017).  Scarlette Calico, MD

## 2016-12-20 NOTE — Patient Instructions (Signed)
Health Maintenance, Female Adopting a healthy lifestyle and getting preventive care can go a long way to promote health and wellness. Talk with your health care provider about what schedule of regular examinations is right for you. This is a good chance for you to check in with your provider about disease prevention and staying healthy. In between checkups, there are plenty of things you can do on your own. Experts have done a lot of research about which lifestyle changes and preventive measures are most likely to keep you healthy. Ask your health care provider for more information. Weight and diet Eat a healthy diet  Be sure to include plenty of vegetables, fruits, low-fat dairy products, and lean protein.  Do not eat a lot of foods high in solid fats, added sugars, or salt.  Get regular exercise. This is one of the most important things you can do for your health.  Most adults should exercise for at least 150 minutes each week. The exercise should increase your heart rate and make you sweat (moderate-intensity exercise).  Most adults should also do strengthening exercises at least twice a week. This is in addition to the moderate-intensity exercise. Maintain a healthy weight  Body mass index (BMI) is a measurement that can be used to identify possible weight problems. It estimates body fat based on height and weight. Your health care provider can help determine your BMI and help you achieve or maintain a healthy weight.  For females 76 years of age and older:  A BMI below 18.5 is considered underweight.  A BMI of 18.5 to 24.9 is normal.  A BMI of 25 to 29.9 is considered overweight.  A BMI of 30 and above is considered obese. Watch levels of cholesterol and blood lipids  You should start having your blood tested for lipids and cholesterol at 47 years of age, then have this test every 5 years.  You may need to have your cholesterol levels checked more often if:  Your lipid or  cholesterol levels are high.  You are older than 47 years of age.  You are at high risk for heart disease. Cancer screening Lung Cancer  Lung cancer screening is recommended for adults 64-42 years old who are at high risk for lung cancer because of a history of smoking.  A yearly low-dose CT scan of the lungs is recommended for people who:  Currently smoke.  Have quit within the past 15 years.  Have at least a 30-pack-year history of smoking. A pack year is smoking an average of one pack of cigarettes a day for 1 year.  Yearly screening should continue until it has been 15 years since you quit.  Yearly screening should stop if you develop a health problem that would prevent you from having lung cancer treatment. Breast Cancer  Practice breast self-awareness. This means understanding how your breasts normally appear and feel.  It also means doing regular breast self-exams. Let your health care provider know about any changes, no matter how small.  If you are in your 20s or 30s, you should have a clinical breast exam (CBE) by a health care provider every 1-3 years as part of a regular health exam.  If you are 34 or older, have a CBE every year. Also consider having a breast X-ray (mammogram) every year.  If you have a family history of breast cancer, talk to your health care provider about genetic screening.  If you are at high risk for breast cancer, talk  to your health care provider about having an MRI and a mammogram every year.  Breast cancer gene (BRCA) assessment is recommended for women who have family members with BRCA-related cancers. BRCA-related cancers include:  Breast.  Ovarian.  Tubal.  Peritoneal cancers.  Results of the assessment will determine the need for genetic counseling and BRCA1 and BRCA2 testing. Cervical Cancer  Your health care provider may recommend that you be screened regularly for cancer of the pelvic organs (ovaries, uterus, and vagina).  This screening involves a pelvic examination, including checking for microscopic changes to the surface of your cervix (Pap test). You may be encouraged to have this screening done every 3 years, beginning at age 24.  For women ages 66-65, health care providers may recommend pelvic exams and Pap testing every 3 years, or they may recommend the Pap and pelvic exam, combined with testing for human papilloma virus (HPV), every 5 years. Some types of HPV increase your risk of cervical cancer. Testing for HPV may also be done on women of any age with unclear Pap test results.  Other health care providers may not recommend any screening for nonpregnant women who are considered low risk for pelvic cancer and who do not have symptoms. Ask your health care provider if a screening pelvic exam is right for you.  If you have had past treatment for cervical cancer or a condition that could lead to cancer, you need Pap tests and screening for cancer for at least 20 years after your treatment. If Pap tests have been discontinued, your risk factors (such as having a new sexual partner) need to be reassessed to determine if screening should resume. Some women have medical problems that increase the chance of getting cervical cancer. In these cases, your health care provider may recommend more frequent screening and Pap tests. Colorectal Cancer  This type of cancer can be detected and often prevented.  Routine colorectal cancer screening usually begins at 47 years of age and continues through 47 years of age.  Your health care provider may recommend screening at an earlier age if you have risk factors for colon cancer.  Your health care provider may also recommend using home test kits to check for hidden blood in the stool.  A small camera at the end of a tube can be used to examine your colon directly (sigmoidoscopy or colonoscopy). This is done to check for the earliest forms of colorectal cancer.  Routine  screening usually begins at age 41.  Direct examination of the colon should be repeated every 5-10 years through 47 years of age. However, you may need to be screened more often if early forms of precancerous polyps or small growths are found. Skin Cancer  Check your skin from head to toe regularly.  Tell your health care provider about any new moles or changes in moles, especially if there is a change in a mole's shape or color.  Also tell your health care provider if you have a mole that is larger than the size of a pencil eraser.  Always use sunscreen. Apply sunscreen liberally and repeatedly throughout the day.  Protect yourself by wearing long sleeves, pants, a wide-brimmed hat, and sunglasses whenever you are outside. Heart disease, diabetes, and high blood pressure  High blood pressure causes heart disease and increases the risk of stroke. High blood pressure is more likely to develop in:  People who have blood pressure in the high end of the normal range (130-139/85-89 mm Hg).  People who are overweight or obese.  People who are African American.  If you are 59-24 years of age, have your blood pressure checked every 3-5 years. If you are 34 years of age or older, have your blood pressure checked every year. You should have your blood pressure measured twice-once when you are at a hospital or clinic, and once when you are not at a hospital or clinic. Record the average of the two measurements. To check your blood pressure when you are not at a hospital or clinic, you can use:  An automated blood pressure machine at a pharmacy.  A home blood pressure monitor.  If you are between 29 years and 60 years old, ask your health care provider if you should take aspirin to prevent strokes.  Have regular diabetes screenings. This involves taking a blood sample to check your fasting blood sugar level.  If you are at a normal weight and have a low risk for diabetes, have this test once  every three years after 47 years of age.  If you are overweight and have a high risk for diabetes, consider being tested at a younger age or more often. Preventing infection Hepatitis B  If you have a higher risk for hepatitis B, you should be screened for this virus. You are considered at high risk for hepatitis B if:  You were born in a country where hepatitis B is common. Ask your health care provider which countries are considered high risk.  Your parents were born in a high-risk country, and you have not been immunized against hepatitis B (hepatitis B vaccine).  You have HIV or AIDS.  You use needles to inject street drugs.  You live with someone who has hepatitis B.  You have had sex with someone who has hepatitis B.  You get hemodialysis treatment.  You take certain medicines for conditions, including cancer, organ transplantation, and autoimmune conditions. Hepatitis C  Blood testing is recommended for:  Everyone born from [birth year] through 1966.  Anyone with known risk factors for hepatitis C. Sexually transmitted infections (STIs)  You should be screened for sexually transmitted infections (STIs) including gonorrhea and chlamydia if:  You are sexually active and are younger than 47 years of age.  You are older than 47 years of age and your health care provider tells you that you are at risk for this type of infection.  Your sexual activity has changed since you were last screened and you are at an increased risk for chlamydia or gonorrhea. Ask your health care provider if you are at risk.  If you do not have HIV, but are at risk, it may be recommended that you take a prescription medicine daily to prevent HIV infection. This is called pre-exposure prophylaxis (PrEP). You are considered at risk if:  You are sexually active and do not regularly use condoms or know the HIV status of your partner(s).  You take drugs by injection.  You are sexually active with a partner  who has HIV. Talk with your health care provider about whether you are at high risk of being infected with HIV. If you choose to begin PrEP, you should first be tested for HIV. You should then be tested every 3 months for as long as you are taking PrEP. Pregnancy  If you are premenopausal and you may become pregnant, ask your health care provider about preconception counseling.  If you may become pregnant, take 400 to 800 micrograms (mcg) of folic acid  every day.  If you want to prevent pregnancy, talk to your health care provider about birth control (contraception). Osteoporosis and menopause  Osteoporosis is a disease in which the bones lose minerals and strength with aging. This can result in serious bone fractures. Your risk for osteoporosis can be identified using a bone density scan.  If you are 4 years of age or older, or if you are at risk for osteoporosis and fractures, ask your health care provider if you should be screened.  Ask your health care provider whether you should take a calcium or vitamin D supplement to lower your risk for osteoporosis.  Menopause may have certain physical symptoms and risks.  Hormone replacement therapy may reduce some of these symptoms and risks. Talk to your health care provider about whether hormone replacement therapy is right for you. Follow these instructions at home:  Schedule regular health, dental, and eye exams.  Stay current with your immunizations.  Do not use any tobacco products including cigarettes, chewing tobacco, or electronic cigarettes.  If you are pregnant, do not drink alcohol.  If you are breastfeeding, limit how much and how often you drink alcohol.  Limit alcohol intake to no more than 1 drink per day for nonpregnant women. One drink equals 12 ounces of beer, 5 ounces of wine, or 1 ounces of hard liquor.  Do not use street drugs.  Do not share needles.  Ask your health care provider for help if you need support  or information about quitting drugs.  Tell your health care provider if you often feel depressed.  Tell your health care provider if you have ever been abused or do not feel safe at home. This information is not intended to replace advice given to you by your health care provider. Make sure you discuss any questions you have with your health care provider. Document Released: 02/20/2011 Document Revised: 01/13/2016 Document Reviewed: 05/11/2015 Elsevier Interactive Patient Education  2017 [employer].

## 2016-12-20 NOTE — Progress Notes (Signed)
Pre visit review using our clinic review tool, if applicable. No additional management support is needed unless otherwise documented below in the visit note. 

## 2016-12-21 ENCOUNTER — Other Ambulatory Visit: Payer: Self-pay | Admitting: Internal Medicine

## 2016-12-21 ENCOUNTER — Encounter: Payer: Self-pay | Admitting: Internal Medicine

## 2016-12-21 DIAGNOSIS — D500 Iron deficiency anemia secondary to blood loss (chronic): Secondary | ICD-10-CM

## 2016-12-21 MED ORDER — IRON 15 MG/1.5ML PO SUSP: 30.0000 mg | Freq: Three times a day (TID) | ORAL | 11 refills | Status: DC

## 2016-12-22 ENCOUNTER — Telehealth: Payer: Self-pay

## 2016-12-22 ENCOUNTER — Telehealth: Payer: Self-pay | Admitting: Internal Medicine

## 2016-12-22 DIAGNOSIS — D500 Iron deficiency anemia secondary to blood loss (chronic): Secondary | ICD-10-CM

## 2016-12-22 LAB — TB SKIN TEST
INDURATION: 0 mm
TB SKIN TEST: NEGATIVE

## 2016-12-22 NOTE — Telephone Encounter (Signed)
Pt states pharmacy has not received Iron 15 MG/1.5ML SUSP    Please resend or call pharmacy.   [facility] on [address] [phone]  Please call Pt once resent

## 2016-12-22 NOTE — Telephone Encounter (Signed)
Per patient, iron supp showing sent to pharm on 5/3 is not there, I have called and given verbally to brittany/pharm---patient advised

## 2016-12-22 NOTE — Telephone Encounter (Signed)
[facility]----we will need to change iron compound to 30mg /0.68 ml in order for patient to get 30mg  ordered x3 daily unless this script is called into a pharmacy that can compound (which is [facility] or [facility])---per dr Sharlet Salina, ok to change this suspension, patient will still be getting 30mg  x3 daily----routing to dr Ronnald Ramp, [facility]

## 2016-12-25 MED ORDER — IRON 15 MG/1.5ML PO SUSP: 30.0000 mg | Freq: Three times a day (TID) | ORAL | 11 refills | Status: DC

## 2016-12-25 NOTE — Telephone Encounter (Signed)
[facility]

## 2017-06-29 ENCOUNTER — Other Ambulatory Visit: Payer: Self-pay | Admitting: Internal Medicine

## 2017-09-29 ENCOUNTER — Other Ambulatory Visit: Payer: Self-pay | Admitting: Internal Medicine

## 2017-10-29 ENCOUNTER — Other Ambulatory Visit: Payer: Self-pay | Admitting: Internal Medicine

## 2018-01-30 ENCOUNTER — Other Ambulatory Visit: Payer: Self-pay | Admitting: Internal Medicine

## 2018-04-03 LAB — HM MAMMOGRAPHY: HM MAMMO: NORMAL (ref 0–4)

## 2018-05-01 ENCOUNTER — Other Ambulatory Visit: Payer: Self-pay | Admitting: Internal Medicine

## 2018-05-09 NOTE — Telephone Encounter (Signed)
Can you call pt to come in for an appt? We can not fill amlodipine until she has made an appt.

## 2018-05-10 NOTE — Telephone Encounter (Signed)
LVM for patient to call back. ?

## 2018-05-14 ENCOUNTER — Other Ambulatory Visit: Payer: Self-pay | Admitting: Obstetrics and Gynecology

## 2018-05-16 NOTE — Patient Instructions (Addendum)
Tabitha Matthews  05/16/2018   Your procedure is scheduled on: 05-23-18   Report to [facility] Main  Entrance    Report to Admitting at 11:00 AM      Call this number if you have problems the morning of surgery [phone]   Remember: Do not eat food or drink liquids :After Midnight. You may have a Clear Liquid Diet from Midnight until 7:00 AM. After 7:00 AM, nothing until after surgery.    CLEAR LIQUID DIET   Foods Allowed                                                                     Foods Excluded  Coffee and tea, regular and decaf                             liquids that you cannot  Plain Jell-O in any flavor                                             see through such as: Fruit ices (not with fruit pulp)                                     milk, soups, orange juice  Iced Popsicles                                    All solid food Carbonated beverages, regular and diet                                    Cranberry, grape and apple juices Sports drinks like Gatorade Lightly seasoned clear broth or consume(fat free) Sugar, honey syrup  Sample Menu Breakfast                                Lunch                                     Supper Cranberry juice                    Beef broth                            Chicken broth Jell-O                                     Grape juice  Apple juice Coffee or tea                        Jell-O                                      Popsicle                                                Coffee or tea                        Coffee or tea  _____________________________________________________________________     BRUSH YOUR TEETH MORNING OF SURGERY AND RINSE YOUR MOUTH OUT, NO CHEWING GUM CANDY OR MINTS.     Take these medicines the morning of surgery with A SIP OF WATER: Amlodipine (Norvasc)                                You may not have any metal on your body including hair pins  and              piercings  Do not wear jewelry, make-up, lotions, powders or perfumes, deodorant             Do not wear nail polish.  Do not shave  48 hours prior to surgery.                 Do not bring valuables to the hospital. [place].  Contacts, dentures or bridgework may not be worn into surgery.  Leave suitcase in the car. After surgery it may be brought to your room.       Special Instructions: N/A              Please read over the following fact sheets you were given: _____________________________________________________________________             [facility] - Preparing for Surgery Before surgery, you can play an important role.  Because skin is not sterile, your skin needs to be as free of germs as possible.  You can reduce the number of germs on your skin by washing with CHG (chlorahexidine gluconate) soap before surgery.  CHG is an antiseptic cleaner which kills germs and bonds with the skin to continue killing germs even after washing. Please DO NOT use if you have an allergy to CHG or antibacterial soaps.  If your skin becomes reddened/irritated stop using the CHG and inform your nurse when you arrive at Short Stay. Do not shave (including legs and underarms) for at least 48 hours prior to the first CHG shower.  You may shave your face/neck. Please follow these instructions carefully:  1.  Shower with CHG Soap the night before surgery and the  morning of Surgery.  2.  If you choose to wash your hair, wash your hair first as usual with your  normal  shampoo.  3.  After you shampoo, rinse your hair and body thoroughly to remove the  shampoo.  4.  Use CHG as you would any other liquid soap.  You can apply chg directly  to the skin and wash                       Gently with a scrungie or clean washcloth.  5.  Apply the CHG Soap to your body ONLY FROM THE NECK DOWN.   Do not use on face/ open                            Wound or open sores. Avoid contact with eyes, ears mouth and genitals (private parts).                       Wash face,  Genitals (private parts) with your normal soap.             6.  Wash thoroughly, paying special attention to the area where your surgery  will be performed.  7.  Thoroughly rinse your body with warm water from the neck down.  8.  DO NOT shower/wash with your normal soap after using and rinsing off  the CHG Soap.                9.  Pat yourself dry with a clean towel.            10.  Wear clean pajamas.            11.  Place clean sheets on your bed the night of your first shower and do not  sleep with pets. Day of Surgery : Do not apply any lotions/deodorants the morning of surgery.  Please wear clean clothes to the hospital/surgery center.  FAILURE TO FOLLOW THESE INSTRUCTIONS MAY RESULT IN THE CANCELLATION OF YOUR SURGERY PATIENT SIGNATURE_________________________________  NURSE SIGNATURE__________________________________  ________________________________________________________________________

## 2018-05-17 ENCOUNTER — Encounter: Payer: Self-pay | Admitting: Obstetrics and Gynecology

## 2018-05-17 ENCOUNTER — Encounter (HOSPITAL_COMMUNITY): Payer: Self-pay

## 2018-05-17 ENCOUNTER — Other Ambulatory Visit: Payer: Self-pay

## 2018-05-17 ENCOUNTER — Other Ambulatory Visit: Payer: Self-pay | Admitting: Obstetrics and Gynecology

## 2018-05-17 ENCOUNTER — Encounter (HOSPITAL_COMMUNITY)
Admission: RE | Admit: 2018-05-17 | Discharge: 2018-05-17 | Disposition: A | Discharge status: Home / Self Care | Payer: BC Managed Care – PPO | Source: Ambulatory Visit | Attending: Obstetrics and Gynecology | Admitting: Obstetrics and Gynecology

## 2018-05-17 ENCOUNTER — Observation Stay (HOSPITAL_COMMUNITY)
Admission: AD | Admit: 2018-05-17 | Discharge: 2018-05-18 | Disposition: A | Discharge status: Home / Self Care | Payer: BC Managed Care – PPO | Source: Ambulatory Visit | Attending: Obstetrics and Gynecology | Admitting: Obstetrics and Gynecology

## 2018-05-17 DIAGNOSIS — D259 Leiomyoma of uterus, unspecified: Secondary | ICD-10-CM

## 2018-05-17 DIAGNOSIS — Z01818 Encounter for other preprocedural examination: Secondary | ICD-10-CM

## 2018-05-17 DIAGNOSIS — D509 Iron deficiency anemia, unspecified: Secondary | ICD-10-CM | POA: Diagnosis present

## 2018-05-17 DIAGNOSIS — I10 Essential (primary) hypertension: Secondary | ICD-10-CM

## 2018-05-17 DIAGNOSIS — R9431 Abnormal electrocardiogram [ECG] [EKG]: Secondary | ICD-10-CM

## 2018-05-17 DIAGNOSIS — D500 Iron deficiency anemia secondary to blood loss (chronic): Secondary | ICD-10-CM

## 2018-05-17 DIAGNOSIS — R799 Abnormal finding of blood chemistry, unspecified: Principal | ICD-10-CM | POA: Insufficient documentation

## 2018-05-17 HISTORY — DX: Essential (primary) hypertension: I10

## 2018-05-17 HISTORY — DX: Iron deficiency anemia, unspecified: D50.9

## 2018-05-17 LAB — CBC
HCT: 22 % — ABNORMAL LOW (ref 36.0–46.0)
HEMOGLOBIN: 5.9 g/dL — AB (ref 12.0–15.0)
MCH: 20 pg — ABNORMAL LOW (ref 26.0–34.0)
MCHC: 26.8 g/dL — ABNORMAL LOW (ref 30.0–36.0)
MCV: 74.6 fL — ABNORMAL LOW (ref 78.0–100.0)
Platelets: 560 10*3/uL — ABNORMAL HIGH (ref 150–400)
RBC: 2.95 MIL/uL — AB (ref 3.87–5.11)
RDW: 21.3 % — ABNORMAL HIGH (ref 11.5–15.5)
WBC: 5 10*3/uL (ref 4.0–10.5)

## 2018-05-17 LAB — BASIC METABOLIC PANEL
ANION GAP: 11 (ref 5–15)
BUN: 11 mg/dL (ref 6–20)
CALCIUM: 9.6 mg/dL (ref 8.9–10.3)
CO2: 26 mmol/L (ref 22–32)
Chloride: 105 mmol/L (ref 98–111)
Creatinine, Ser: 1.03 mg/dL — ABNORMAL HIGH (ref 0.44–1.00)
Glucose, Bld: 105 mg/dL — ABNORMAL HIGH (ref 70–99)
Potassium: 3.5 mmol/L (ref 3.5–5.1)
Sodium: 142 mmol/L (ref 135–145)

## 2018-05-17 LAB — ABO/RH: ABO/RH(D): O POS

## 2018-05-17 LAB — PREPARE RBC (CROSSMATCH)

## 2018-05-17 LAB — HEMOGLOBIN AND HEMATOCRIT, BLOOD
HCT: 27.9 % — ABNORMAL LOW (ref 36.0–46.0)
Hemoglobin: 8.3 g/dL — ABNORMAL LOW (ref 12.0–15.0)

## 2018-05-17 MED ORDER — SODIUM CHLORIDE 0.9% IV SOLUTION
Freq: Once | INTRAVENOUS | Status: AC
  Administered 2018-05-17: 22:00:00 via INTRAVENOUS

## 2018-05-17 MED ORDER — DIPHENHYDRAMINE HCL 50 MG/ML IJ SOLN
25.0000 mg | Freq: Once | INTRAMUSCULAR | Status: AC
  Administered 2018-05-17: 25 mg via INTRAVENOUS
  Filled 2018-05-17: qty 1

## 2018-05-17 MED ORDER — ACETAMINOPHEN 325 MG PO TABS
650.0000 mg | ORAL_TABLET | Freq: Once | ORAL | Status: AC
  Administered 2018-05-17: 650 mg via ORAL
  Filled 2018-05-17: qty 2

## 2018-05-17 MED ORDER — SODIUM CHLORIDE 0.9% IV SOLUTION
Freq: Once | INTRAVENOUS | Status: AC
  Administered 2018-05-17: 15:00:00 via INTRAVENOUS

## 2018-05-17 NOTE — Progress Notes (Addendum)
RN received CBC critcal value of 5.9 . Dr. Harvie Bridge paged, but was in surgery. Per Dr. Harvie Bridge staff, she will call back.Marland Kitchen  Spoke to Dr. Garwin Brothers. Dr. Garwin Brothers asked that critical lab value be faxed to the office for her nurse to handle. Fax successfully transmitted to Dr. Harvie Bridge office. Dr. Harvie Bridge plan is to notify patient, and transfuse patient at [facility].

## 2018-05-18 DIAGNOSIS — R799 Abnormal finding of blood chemistry, unspecified: Secondary | ICD-10-CM | POA: Diagnosis not present

## 2018-05-19 LAB — BPAM RBC
BLOOD PRODUCT EXPIRATION DATE: 201910162359
BLOOD PRODUCT EXPIRATION DATE: 201910232359
Blood Product Expiration Date: 201910202359
Blood Product Expiration Date: 201910242359
ISSUE DATE / TIME: 201909271510
ISSUE DATE / TIME: 201909271645
ISSUE DATE / TIME: 201909271757
ISSUE DATE / TIME: 201909272259
UNIT TYPE AND RH: 5100
Unit Type and Rh: 5100
Unit Type and Rh: 9500
Unit Type and Rh: 9500

## 2018-05-19 LAB — TYPE AND SCREEN
ABO/RH(D): O POS
ANTIBODY SCREEN: NEGATIVE
UNIT DIVISION: 0
UNIT DIVISION: 0
Unit division: 0
Unit division: 0

## 2018-05-22 ENCOUNTER — Other Ambulatory Visit: Payer: Self-pay | Admitting: Internal Medicine

## 2018-05-23 ENCOUNTER — Ambulatory Visit (HOSPITAL_COMMUNITY): Payer: BC Managed Care – PPO | Admitting: Anesthesiology

## 2018-05-23 ENCOUNTER — Encounter (HOSPITAL_COMMUNITY): Payer: Self-pay | Admitting: Anesthesiology

## 2018-05-23 ENCOUNTER — Ambulatory Visit (HOSPITAL_COMMUNITY)
Admission: RE | Admit: 2018-05-23 | Discharge: 2018-05-24 | Disposition: A | Discharge status: Home / Self Care | Payer: BC Managed Care – PPO | Source: Ambulatory Visit | Attending: Obstetrics and Gynecology | Admitting: Obstetrics and Gynecology

## 2018-05-23 ENCOUNTER — Other Ambulatory Visit: Payer: Self-pay

## 2018-05-23 ENCOUNTER — Encounter (HOSPITAL_COMMUNITY)
Admission: RE | Disposition: A | Discharge status: Home / Self Care | Payer: Self-pay | Source: Ambulatory Visit | Attending: Obstetrics and Gynecology

## 2018-05-23 DIAGNOSIS — D252 Subserosal leiomyoma of uterus: Secondary | ICD-10-CM | POA: Insufficient documentation

## 2018-05-23 DIAGNOSIS — D251 Intramural leiomyoma of uterus: Secondary | ICD-10-CM | POA: Diagnosis present

## 2018-05-23 DIAGNOSIS — N888 Other specified noninflammatory disorders of cervix uteri: Secondary | ICD-10-CM | POA: Diagnosis not present

## 2018-05-23 DIAGNOSIS — N800 Endometriosis of uterus: Secondary | ICD-10-CM | POA: Insufficient documentation

## 2018-05-23 DIAGNOSIS — I10 Essential (primary) hypertension: Secondary | ICD-10-CM | POA: Insufficient documentation

## 2018-05-23 DIAGNOSIS — Z79899 Other long term (current) drug therapy: Secondary | ICD-10-CM | POA: Diagnosis not present

## 2018-05-23 DIAGNOSIS — D250 Submucous leiomyoma of uterus: Secondary | ICD-10-CM | POA: Diagnosis not present

## 2018-05-23 DIAGNOSIS — N838 Other noninflammatory disorders of ovary, fallopian tube and broad ligament: Secondary | ICD-10-CM | POA: Insufficient documentation

## 2018-05-23 DIAGNOSIS — Z90710 Acquired absence of both cervix and uterus: Secondary | ICD-10-CM | POA: Diagnosis present

## 2018-05-23 DIAGNOSIS — N920 Excessive and frequent menstruation with regular cycle: Secondary | ICD-10-CM | POA: Insufficient documentation

## 2018-05-23 DIAGNOSIS — D509 Iron deficiency anemia, unspecified: Secondary | ICD-10-CM | POA: Diagnosis not present

## 2018-05-23 DIAGNOSIS — N858 Other specified noninflammatory disorders of uterus: Secondary | ICD-10-CM | POA: Insufficient documentation

## 2018-05-23 HISTORY — PX: ROBOTIC ASSISTED LAPAROSCOPIC HYSTERECTOMY AND SALPINGECTOMY: SHX6379

## 2018-05-23 LAB — TYPE AND SCREEN
ABO/RH(D): O POS
ANTIBODY SCREEN: NEGATIVE

## 2018-05-23 LAB — ABO/RH: ABO/RH(D): O POS

## 2018-05-23 LAB — HEMOGLOBIN AND HEMATOCRIT, BLOOD
HEMATOCRIT: 36.2 % (ref 36.0–46.0)
HEMOGLOBIN: 10.9 g/dL — AB (ref 12.0–15.0)

## 2018-05-23 SURGERY — XI ROBOTIC ASSISTED LAPAROSCOPIC HYSTERECTOMY AND SALPINGECTOMY
Anesthesia: General | Laterality: Bilateral

## 2018-05-23 MED ORDER — DEXAMETHASONE SODIUM PHOSPHATE 10 MG/ML IJ SOLN
INTRAMUSCULAR | Status: AC
  Filled 2018-05-23: qty 1

## 2018-05-23 MED ORDER — HYDROMORPHONE HCL 1 MG/ML IJ SOLN
0.2500 mg | INTRAMUSCULAR | Status: DC | PRN
  Administered 2018-05-23: 0.25 mg via INTRAVENOUS
  Administered 2018-05-23: 0.5 mg via INTRAVENOUS

## 2018-05-23 MED ORDER — KETAMINE HCL 10 MG/ML IJ SOLN
INTRAMUSCULAR | Status: DC | PRN
  Administered 2018-05-23 (×2): 25 mg via INTRAVENOUS

## 2018-05-23 MED ORDER — SUGAMMADEX SODIUM 200 MG/2ML IV SOLN
INTRAVENOUS | Status: AC
  Filled 2018-05-23: qty 2

## 2018-05-23 MED ORDER — DEXTROSE IN LACTATED RINGERS 5 % IV SOLN
INTRAVENOUS | Status: DC
  Administered 2018-05-23: 18:00:00 via INTRAVENOUS

## 2018-05-23 MED ORDER — ONDANSETRON HCL 4 MG/2ML IJ SOLN
INTRAMUSCULAR | Status: AC
  Filled 2018-05-23: qty 2

## 2018-05-23 MED ORDER — PROPOFOL 10 MG/ML IV BOLUS
INTRAVENOUS | Status: AC
  Filled 2018-05-23: qty 20

## 2018-05-23 MED ORDER — PANTOPRAZOLE SODIUM 40 MG PO TBEC
DELAYED_RELEASE_TABLET | ORAL | Status: AC
  Filled 2018-05-23: qty 1

## 2018-05-23 MED ORDER — PROPOFOL 10 MG/ML IV BOLUS
INTRAVENOUS | Status: DC | PRN
  Administered 2018-05-23: 150 mg via INTRAVENOUS
  Administered 2018-05-23: 50 mg via INTRAVENOUS

## 2018-05-23 MED ORDER — EPHEDRINE 5 MG/ML INJ
INTRAVENOUS | Status: AC
  Filled 2018-05-23: qty 10

## 2018-05-23 MED ORDER — ROCURONIUM BROMIDE 10 MG/ML (PF) SYRINGE
PREFILLED_SYRINGE | INTRAVENOUS | Status: DC | PRN
  Administered 2018-05-23: 70 mg via INTRAVENOUS

## 2018-05-23 MED ORDER — SODIUM CHLORIDE 0.9 % IJ SOLN
INTRAMUSCULAR | Status: AC
  Filled 2018-05-23: qty 50

## 2018-05-23 MED ORDER — HYDROMORPHONE HCL 1 MG/ML IJ SOLN
INTRAMUSCULAR | Status: AC
  Administered 2018-05-23: 0.25 mg via INTRAVENOUS
  Filled 2018-05-23: qty 1

## 2018-05-23 MED ORDER — SCOPOLAMINE 1 MG/3DAYS TD PT72
MEDICATED_PATCH | TRANSDERMAL | Status: AC
  Filled 2018-05-23: qty 1

## 2018-05-23 MED ORDER — IBUPROFEN 200 MG PO TABS: 800.0000 mg | ORAL_TABLET | Freq: Three times a day (TID) | ORAL | Status: DC | PRN

## 2018-05-23 MED ORDER — LIDOCAINE 2% (20 MG/ML) 5 ML SYRINGE
INTRAMUSCULAR | Status: AC
  Filled 2018-05-23: qty 15

## 2018-05-23 MED ORDER — LIDOCAINE 2% (20 MG/ML) 5 ML SYRINGE
INTRAMUSCULAR | Status: DC | PRN
  Administered 2018-05-23: 1.5 mg/kg/h via INTRAVENOUS

## 2018-05-23 MED ORDER — ROCURONIUM BROMIDE 10 MG/ML (PF) SYRINGE
PREFILLED_SYRINGE | INTRAVENOUS | Status: AC
  Filled 2018-05-23: qty 10

## 2018-05-23 MED ORDER — FENTANYL CITRATE (PF) 100 MCG/2ML IJ SOLN
INTRAMUSCULAR | Status: DC | PRN
  Administered 2018-05-23: 50 ug via INTRAVENOUS
  Administered 2018-05-23: 100 ug via INTRAVENOUS

## 2018-05-23 MED ORDER — ONDANSETRON HCL 4 MG/2ML IJ SOLN: 4.0000 mg | Freq: Four times a day (QID) | INTRAMUSCULAR | Status: DC | PRN

## 2018-05-23 MED ORDER — KETAMINE HCL 10 MG/ML IJ SOLN
INTRAMUSCULAR | Status: AC
  Filled 2018-05-23: qty 1

## 2018-05-23 MED ORDER — ACETAMINOPHEN 10 MG/ML IV SOLN
INTRAVENOUS | Status: DC | PRN
  Administered 2018-05-23: 1000 mg via INTRAVENOUS

## 2018-05-23 MED ORDER — IRON 15 MG/1.5ML PO SUSP: 30.0000 mg | Freq: Three times a day (TID) | ORAL | Status: DC

## 2018-05-23 MED ORDER — FERROUS SULFATE 300 (60 FE) MG/5ML PO SYRP
150.0000 mg | ORAL_SOLUTION | Freq: Three times a day (TID) | ORAL | Status: DC
  Filled 2018-05-23: qty 5

## 2018-05-23 MED ORDER — EPHEDRINE SULFATE-NACL 50-0.9 MG/10ML-% IV SOSY
PREFILLED_SYRINGE | INTRAVENOUS | Status: DC | PRN
  Administered 2018-05-23: 10 mg via INTRAVENOUS

## 2018-05-23 MED ORDER — FENTANYL CITRATE (PF) 250 MCG/5ML IJ SOLN
INTRAMUSCULAR | Status: AC
  Filled 2018-05-23: qty 5

## 2018-05-23 MED ORDER — SIMETHICONE 80 MG PO CHEW: 80.0000 mg | CHEWABLE_TABLET | Freq: Four times a day (QID) | ORAL | Status: DC | PRN

## 2018-05-23 MED ORDER — MIDAZOLAM HCL 2 MG/2ML IJ SOLN
INTRAMUSCULAR | Status: DC | PRN
  Administered 2018-05-23: 2 mg via INTRAVENOUS

## 2018-05-23 MED ORDER — HYDROMORPHONE HCL 1 MG/ML IJ SOLN: 0.2000 mg | INTRAMUSCULAR | Status: DC | PRN

## 2018-05-23 MED ORDER — LIDOCAINE 2% (20 MG/ML) 5 ML SYRINGE
INTRAMUSCULAR | Status: AC
  Filled 2018-05-23: qty 5

## 2018-05-23 MED ORDER — MENTHOL 3 MG MT LOZG: 1.0000 | LOZENGE | OROMUCOSAL | Status: DC | PRN

## 2018-05-23 MED ORDER — PANTOPRAZOLE SODIUM 40 MG PO TBEC
40.0000 mg | DELAYED_RELEASE_TABLET | Freq: Every day | ORAL | Status: DC
  Administered 2018-05-23: 40 mg via ORAL

## 2018-05-23 MED ORDER — ONDANSETRON HCL 4 MG PO TABS: 4.0000 mg | ORAL_TABLET | Freq: Four times a day (QID) | ORAL | Status: DC | PRN

## 2018-05-23 MED ORDER — ONDANSETRON HCL 4 MG/2ML IJ SOLN
INTRAMUSCULAR | Status: DC | PRN
Start: 2018-05-23 — End: 2018-05-23
  Administered 2018-05-23: 4 mg via INTRAVENOUS

## 2018-05-23 MED ORDER — CEFAZOLIN SODIUM-DEXTROSE 2-4 GM/100ML-% IV SOLN
2.0000 g | INTRAVENOUS | Status: AC
  Administered 2018-05-23: 2 g via INTRAVENOUS
  Filled 2018-05-23: qty 100

## 2018-05-23 MED ORDER — HYDROCODONE-ACETAMINOPHEN 5-325 MG PO TABS
1.0000 | ORAL_TABLET | ORAL | Status: DC | PRN
  Administered 2018-05-24: 1 via ORAL

## 2018-05-23 MED ORDER — BUPIVACAINE HCL (PF) 0.25 % IJ SOLN
INTRAMUSCULAR | Status: DC | PRN
  Administered 2018-05-23: 10 mL

## 2018-05-23 MED ORDER — SODIUM CHLORIDE 0.9 % IV SOLN
INTRAVENOUS | Status: DC | PRN
  Administered 2018-05-23: 60 mL

## 2018-05-23 MED ORDER — ROPIVACAINE HCL 5 MG/ML IJ SOLN
INTRAMUSCULAR | Status: AC
  Filled 2018-05-23: qty 30

## 2018-05-23 MED ORDER — DEXAMETHASONE SODIUM PHOSPHATE 10 MG/ML IJ SOLN
INTRAMUSCULAR | Status: DC | PRN
  Administered 2018-05-23: 10 mg via INTRAVENOUS

## 2018-05-23 MED ORDER — KETOROLAC TROMETHAMINE 30 MG/ML IJ SOLN
INTRAMUSCULAR | Status: AC
  Administered 2018-05-23: 30 mg via INTRAVENOUS
  Filled 2018-05-23: qty 1

## 2018-05-23 MED ORDER — KETOROLAC TROMETHAMINE 30 MG/ML IJ SOLN
INTRAMUSCULAR | Status: AC
  Filled 2018-05-23: qty 1

## 2018-05-23 MED ORDER — ACETAMINOPHEN 10 MG/ML IV SOLN
INTRAVENOUS | Status: AC
  Filled 2018-05-23: qty 100

## 2018-05-23 MED ORDER — LIDOCAINE 2% (20 MG/ML) 5 ML SYRINGE
INTRAMUSCULAR | Status: DC | PRN
  Administered 2018-05-23: 40 mg via INTRAVENOUS

## 2018-05-23 MED ORDER — BUPIVACAINE HCL (PF) 0.25 % IJ SOLN
INTRAMUSCULAR | Status: AC
  Filled 2018-05-23: qty 30

## 2018-05-23 MED ORDER — KETOROLAC TROMETHAMINE 30 MG/ML IJ SOLN
30.0000 mg | Freq: Four times a day (QID) | INTRAMUSCULAR | Status: DC
  Administered 2018-05-23 – 2018-05-24 (×3): 30 mg via INTRAVENOUS

## 2018-05-23 MED ORDER — LACTATED RINGERS IV SOLN
INTRAVENOUS | Status: DC
  Administered 2018-05-23 (×2): via INTRAVENOUS

## 2018-05-23 MED ORDER — SUGAMMADEX SODIUM 200 MG/2ML IV SOLN
INTRAVENOUS | Status: DC | PRN
  Administered 2018-05-23: 200 mg via INTRAVENOUS

## 2018-05-23 MED ORDER — SODIUM CHLORIDE 0.9 % IJ SOLN
INTRAMUSCULAR | Status: DC | PRN
  Administered 2018-05-23: 10 mL

## 2018-05-23 MED ORDER — KETOROLAC TROMETHAMINE 30 MG/ML IJ SOLN: 30.0000 mg | Freq: Four times a day (QID) | INTRAMUSCULAR | Status: DC

## 2018-05-23 MED ORDER — MIDAZOLAM HCL 2 MG/2ML IJ SOLN
INTRAMUSCULAR | Status: AC
  Filled 2018-05-23: qty 2

## 2018-05-23 MED ORDER — AMLODIPINE BESYLATE 10 MG PO TABS
10.0000 mg | ORAL_TABLET | Freq: Every day | ORAL | Status: DC
  Filled 2018-05-23: qty 1

## 2018-05-23 MED ORDER — SODIUM CHLORIDE 0.9 % IR SOLN
Status: DC | PRN
  Administered 2018-05-23: 3000 mL

## 2018-05-23 MED ORDER — PROMETHAZINE HCL 25 MG/ML IJ SOLN: 6.2500 mg | INTRAMUSCULAR | Status: DC | PRN

## 2018-05-23 MED ORDER — KETOROLAC TROMETHAMINE 30 MG/ML IJ SOLN: 30.0000 mg | Freq: Once | INTRAMUSCULAR | Status: AC | PRN

## 2018-05-23 SURGICAL SUPPLY — 53 items
ADH SKN CLS APL DERMABOND .7 (GAUZE/BANDAGES/DRESSINGS) ×1
APL SRG 38 LTWT LNG FL B (MISCELLANEOUS)
APPLICATOR ARISTA FLEXITIP XL (MISCELLANEOUS) IMPLANT
BARRIER ADHS 3X4 INTERCEED (GAUZE/BANDAGES/DRESSINGS) IMPLANT
BRR ADH 4X3 ABS CNTRL BYND (GAUZE/BANDAGES/DRESSINGS)
CANISTER SUCT 3000ML PPV (MISCELLANEOUS) ×3 IMPLANT
CATH FOLEY 3WAY  5CC 16FR (CATHETERS) ×2
CATH FOLEY 3WAY 5CC 16FR (CATHETERS) ×1 IMPLANT
COVER BACK TABLE 60X90IN (DRAPES) ×3 IMPLANT
COVER TIP SHEARS 8 DVNC (MISCELLANEOUS) ×1 IMPLANT
COVER TIP SHEARS 8MM DA VINCI (MISCELLANEOUS) ×2
DEFOGGER SCOPE WARMER CLEARIFY (MISCELLANEOUS) ×3 IMPLANT
DERMABOND ADVANCED (GAUZE/BANDAGES/DRESSINGS) ×2
DERMABOND ADVANCED .7 DNX12 (GAUZE/BANDAGES/DRESSINGS) ×1 IMPLANT
DRAPE ARM DVNC X/XI (DISPOSABLE) ×4 IMPLANT
DRAPE COLUMN DVNC XI (DISPOSABLE) ×1 IMPLANT
DRAPE DA VINCI XI ARM (DISPOSABLE) ×8
DRAPE DA VINCI XI COLUMN (DISPOSABLE) ×2
DURAPREP 26ML APPLICATOR (WOUND CARE) ×3 IMPLANT
ELECT REM PT RETURN 15FT ADLT (MISCELLANEOUS) ×3 IMPLANT
GLOVE BIOGEL PI IND STRL 7.0 (GLOVE) ×5 IMPLANT
GLOVE BIOGEL PI INDICATOR 7.0 (GLOVE) ×10
GLOVE ECLIPSE 6.5 STRL STRAW (GLOVE) ×9 IMPLANT
HEMOSTAT ARISTA ABSORB 3G PWDR (MISCELLANEOUS) IMPLANT
IRRIG SUCT STRYKERFLOW 2 WTIP (MISCELLANEOUS) ×3
IRRIGATION SUCT STRKRFLW 2 WTP (MISCELLANEOUS) ×1 IMPLANT
LEGGING LITHOTOMY PAIR STRL (DRAPES) ×3 IMPLANT
NEEDLE INSUFFLATION 120MM (ENDOMECHANICALS) ×3 IMPLANT
OBTURATOR OPTICAL STANDARD 8MM (TROCAR) ×2
OBTURATOR OPTICAL STND 8 DVNC (TROCAR) ×1
OBTURATOR OPTICALSTD 8 DVNC (TROCAR) IMPLANT
OCCLUDER COLPOPNEUMO (BALLOONS) ×3 IMPLANT
PACK ROBOT WH (CUSTOM PROCEDURE TRAY) ×3 IMPLANT
PACK ROBOTIC GOWN (GOWN DISPOSABLE) ×3 IMPLANT
PACK TRENDGUARD 450 HYBRID PRO (MISCELLANEOUS) IMPLANT
PAD PREP 24X48 CUFFED NSTRL (MISCELLANEOUS) ×3 IMPLANT
POSITIONER SURGICAL ARM (MISCELLANEOUS) ×6 IMPLANT
SEAL CANN UNIV 5-8 DVNC XI (MISCELLANEOUS) ×3 IMPLANT
SEAL XI 5MM-8MM UNIVERSAL (MISCELLANEOUS) ×6
SEALER VESSEL DA VINCI XI (MISCELLANEOUS) ×2
SEALER VESSEL EXT DVNC XI (MISCELLANEOUS) IMPLANT
SET CYSTO W/LG BORE CLAMP LF (SET/KITS/TRAYS/PACK) IMPLANT
SET TRI-LUMEN FLTR TB AIRSEAL (TUBING) ×3 IMPLANT
SUT VIC AB 0 CT1 36 (SUTURE) ×6 IMPLANT
SUT VICRYL 0 UR6 27IN ABS (SUTURE) ×4 IMPLANT
SUT VICRYL 4-0 PS2 18IN ABS (SUTURE) ×6 IMPLANT
SUT VLOC 180 0 9IN  GS21 (SUTURE)
SUT VLOC 180 0 9IN GS21 (SUTURE) IMPLANT
TIP UTERINE 6.7X8CM BLUE DISP (MISCELLANEOUS) ×2 IMPLANT
TOWEL OR 17X26 10 PK STRL BLUE (TOWEL DISPOSABLE) ×6 IMPLANT
TRENDGUARD 450 HYBRID PRO PACK (MISCELLANEOUS) ×3
TROCAR PORT AIRSEAL 8X120 (TROCAR) ×3 IMPLANT
WATER STERILE IRR 1000ML POUR (IV SOLUTION) ×3 IMPLANT

## 2018-05-23 NOTE — Anesthesia Procedure Notes (Signed)
Procedure Name: Intubation Date/Time: 05/23/2018 1:08 PM Performed by: Wanita Chamberlain, CRNA Pre-anesthesia Checklist: Patient identified, Emergency Drugs available, Suction available, Patient being monitored and Timeout performed Patient Re-evaluated:Patient Re-evaluated prior to induction Oxygen Delivery Method: Circle system utilized Preoxygenation: Pre-oxygenation with 100% oxygen Induction Type: IV induction Ventilation: Mask ventilation without difficulty Laryngoscope Size: Mac and 3 Grade View: Grade II Tube type: Oral Tube size: 7.0 mm Number of attempts: 1 Airway Equipment and Method: Stylet Placement Confirmation: breath sounds checked- equal and bilateral,  CO2 detector,  positive ETCO2 and ETT inserted through vocal cords under direct vision Secured at: 22 cm Tube secured with: Tape Dental Injury: Teeth and Oropharynx as per pre-operative assessment

## 2018-05-23 NOTE — Brief Op Note (Signed)
05/23/2018  4:11 PM  PATIENT:  Tabitha Matthews  47 y.o. female  PRE-OPERATIVE DIAGNOSIS:  Symptomatic Uterine Fibroids  POST-OPERATIVE DIAGNOSIS:  Symptomatic Uterine Fibroids, left  Paratubal cyst  PROCEDURE:  Da vinci XI robotic total hysterectomy, bilateral salpingectomy  SURGEON:  Surgeon(s) and Role:    * Servando Salina, MD - Primary  PHYSICIAN ASSISTANT:   ASSISTANTS: Brien Few, MD   ANESTHESIA:   general FINDINGS;  Fibroid uterus, nl tubes, left paratubal cyst, nl ovaries, nl liver edge EBL:  50 mL   BLOOD ADMINISTERED:none  DRAINS: none   LOCAL MEDICATIONS USED:  MARCAINE    and BUPIVICAINE   SPECIMEN:  Source of Specimen:  morcellated uterus with cervix, tubes, paratubal cyst  DISPOSITION OF SPECIMEN:  PATHOLOGY  COUNTS:  YES  TOURNIQUET:  * No tourniquets in log *  DICTATION: .Other Dictation: Dictation Number [id number]  PLAN OF CARE: Admit for overnight observation  PATIENT DISPOSITION:  PACU - hemodynamically stable.   Delay start of Pharmacological VTE agent (>24hrs) due to surgical blood loss or risk of bleeding: no

## 2018-05-23 NOTE — Anesthesia Postprocedure Evaluation (Signed)
Anesthesia Post Note  Patient: Tabitha Matthews  Procedure(s) Performed: XI ROBOTIC ASSISTED LAPAROSCOPIC HYSTERECTOMY AND SALPINGECTOMY (Bilateral )     Patient location during evaluation: PACU Anesthesia Type: General Level of consciousness: awake and alert Pain management: pain level controlled Vital Signs Assessment: post-procedure vital signs reviewed and stable Respiratory status: spontaneous breathing, nonlabored ventilation, respiratory function stable and patient connected to nasal cannula oxygen Cardiovascular status: blood pressure returned to baseline and stable Postop Assessment: no apparent nausea or vomiting Anesthetic complications: no    Last Vitals:  Vitals:   05/23/18 1630 05/23/18 1645  BP: 124/81 130/77  Pulse: 89 75  Resp: 19 17  Temp:    SpO2: 100% 100%    Last Pain:  Vitals:   05/23/18 1209  TempSrc:   PainSc: 0-No pain                 Ronika Kelson S

## 2018-05-23 NOTE — Anesthesia Preprocedure Evaluation (Addendum)
Anesthesia Evaluation  Patient identified by MRN, date of birth, ID band Patient awake    Reviewed: Allergy & Precautions, NPO status , Patient's Chart, lab work & pertinent test results  Airway Mallampati: II  TM Distance: >3 FB Neck ROM: Full    Dental no notable dental hx. (+) Teeth Intact, Dental Advisory Given   Pulmonary neg pulmonary ROS,    Pulmonary exam normal breath sounds clear to auscultation       Cardiovascular hypertension, Pt. on medications Normal cardiovascular exam Rhythm:Regular Rate:Normal     Neuro/Psych negative neurological ROS  negative psych ROS   GI/Hepatic negative GI ROS, Neg liver ROS,   Endo/Other  negative endocrine ROS  Renal/GU negative Renal ROS  negative genitourinary   Musculoskeletal negative musculoskeletal ROS (+)   Abdominal   Peds negative pediatric ROS (+)  Hematology  (+) anemia ,   Anesthesia Other Findings Hgb 5.9 on 05-17-18   Pt transfused with 4 units of PRBC's. Hgb today 10.9  Reproductive/Obstetrics negative OB ROS                          Anesthesia Physical Anesthesia Plan  ASA: II  Anesthesia Plan: General   Post-op Pain Management:    Induction: Intravenous  PONV Risk Score and Plan: 3 and Ondansetron, Dexamethasone, Scopolamine patch - Pre-op, Midazolam and Treatment may vary due to age or medical condition  Airway Management Planned: Oral ETT  Additional Equipment:   Intra-op Plan:   Post-operative Plan: Extubation in OR  Informed Consent: I have reviewed the patients History and Physical, chart, labs and discussed the procedure including the risks, benefits and alternatives for the proposed anesthesia with the patient or authorized representative who has indicated his/her understanding and acceptance.   Dental advisory given  Plan Discussed with: CRNA and Surgeon  Anesthesia Plan Comments:         Anesthesia  Quick Evaluation

## 2018-05-23 NOTE — Transfer of Care (Signed)
Immediate Anesthesia Transfer of Care Note  Patient: Tabitha Matthews  Procedure(s) Performed: XI ROBOTIC ASSISTED LAPAROSCOPIC HYSTERECTOMY AND SALPINGECTOMY (Bilateral )  Patient Location: PACU  Anesthesia Type:General  Level of Consciousness: awake, alert , oriented and patient cooperative  Airway & Oxygen Therapy: Patient Spontanous Breathing and Patient connected to face mask oxygen  Post-op Assessment: Report given to RN and Post -op Vital signs reviewed and stable  Post vital signs: Reviewed and stable  Last Vitals:  Vitals Value Taken Time  BP 120/76 05/23/2018  4:20 PM  Temp    Pulse 91 05/23/2018  4:23 PM  Resp 24 05/23/2018  4:23 PM  SpO2 100 % 05/23/2018  4:23 PM  Vitals shown include unvalidated device data.  Last Pain:  Vitals:   05/23/18 1209  TempSrc:   PainSc: 0-No pain         Complications: No apparent anesthesia complications

## 2018-05-24 ENCOUNTER — Telehealth: Payer: Self-pay | Admitting: Internal Medicine

## 2018-05-24 ENCOUNTER — Other Ambulatory Visit: Payer: Self-pay | Admitting: Internal Medicine

## 2018-05-24 ENCOUNTER — Encounter (HOSPITAL_COMMUNITY): Payer: Self-pay | Admitting: Obstetrics and Gynecology

## 2018-05-24 DIAGNOSIS — D251 Intramural leiomyoma of uterus: Secondary | ICD-10-CM | POA: Diagnosis not present

## 2018-05-24 DIAGNOSIS — D500 Iron deficiency anemia secondary to blood loss (chronic): Secondary | ICD-10-CM

## 2018-05-24 LAB — CBC
HEMATOCRIT: 33.8 % — AB (ref 36.0–46.0)
HEMOGLOBIN: 10 g/dL — AB (ref 12.0–15.0)
MCH: 23.9 pg — ABNORMAL LOW (ref 26.0–34.0)
MCHC: 29.6 g/dL — AB (ref 30.0–36.0)
MCV: 80.9 fL (ref 78.0–100.0)
Platelets: 357 10*3/uL (ref 150–400)
RBC: 4.18 MIL/uL (ref 3.87–5.11)
RDW: 22 % — ABNORMAL HIGH (ref 11.5–15.5)
WBC: 8.6 10*3/uL (ref 4.0–10.5)

## 2018-05-24 LAB — BASIC METABOLIC PANEL
Anion gap: 10 (ref 5–15)
BUN: 13 mg/dL (ref 6–20)
CHLORIDE: 104 mmol/L (ref 98–111)
CO2: 27 mmol/L (ref 22–32)
Calcium: 10 mg/dL (ref 8.9–10.3)
Creatinine, Ser: 1.08 mg/dL — ABNORMAL HIGH (ref 0.44–1.00)
GFR calc non Af Amer: >60 mL/min (ref >60)
Glucose, Bld: 135 mg/dL — ABNORMAL HIGH (ref 70–99)
POTASSIUM: 4 mmol/L (ref 3.5–5.1)
SODIUM: 141 mmol/L (ref 135–145)

## 2018-05-24 MED ORDER — IBUPROFEN 800 MG PO TABS: 800.0000 mg | ORAL_TABLET | Freq: Three times a day (TID) | ORAL | 0 refills | Status: DC | PRN

## 2018-05-24 MED ORDER — AMLODIPINE BESYLATE 10 MG PO TABS: 10.0000 mg | ORAL_TABLET | Freq: Every day | ORAL | 0 refills | Status: DC

## 2018-05-24 MED ORDER — HYDROCODONE-ACETAMINOPHEN 5-325 MG PO TABS: 1.0000 | ORAL_TABLET | Freq: Four times a day (QID) | ORAL | 0 refills | Status: DC | PRN

## 2018-05-24 MED ORDER — KETOROLAC TROMETHAMINE 30 MG/ML IJ SOLN
INTRAMUSCULAR | Status: AC
  Filled 2018-05-24: qty 1

## 2018-05-24 MED ORDER — HYDROCODONE-ACETAMINOPHEN 5-325 MG PO TABS
ORAL_TABLET | ORAL | Status: AC
  Filled 2018-05-24: qty 1

## 2018-05-24 NOTE — Telephone Encounter (Signed)
Patient calling and made an appointment for 06/17/18 for CPE. After finding out this medication was denied and she needed an appointment, she wanted to move CPE appointment up. Dr Ronnald Ramp does not currently have and med refill OV available. Patient would like to know if enough medication could be sent to the pharmacy to last until her scheduled appointment? Please advise.  [id number] - [place], [place].

## 2018-05-24 NOTE — Telephone Encounter (Signed)
Requested Prescriptions  Pending Prescriptions Disp Refills  . amLODipine (NORVASC) 10 MG tablet 30 tablet 0    Sig: Take 1 tablet (10 mg total) by mouth daily.     Cardiovascular:  Calcium Channel Blockers Failed - 05/24/2018  2:31 PM      Failed - Valid encounter within last 6 months    Recent Outpatient Visits          1 year ago Essential hypertension   [place], Thomas L, MD   2 years ago Essential hypertension   [place], Thomas L, MD   2 years ago Anemia, iron deficiency   [place], Thomas L, MD   4 years ago Anemia, iron deficiency   [place], Thomas L, MD   5 years ago [place], MD      Future Appointments            In 3 weeks Janith Lima, MD [facility], [place] BP in normal range    BP Readings from Last 1 Encounters:  05/24/18 120/76

## 2018-05-24 NOTE — Discharge Summary (Signed)
Physician Discharge Summary  Patient ID: Tabitha Matthews MRN: [id number] DOB/AGE: July 01, 1971 47 y.o.  Admit date: 05/23/2018 Discharge date: 05/24/2018  Admission Diagnoses: symptomatic uterine fibroids, IDA  Discharge Diagnoses: symptomatic uterine fibroids, left paratubal cyst, IDA Active Problems:   Status post total hysterectomy   Discharged Condition: stable  Hospital Course: pt underwent davinci robotic total hysterectomy, bilateral salpingectomy, left paratubal cyst removal. Uncomplicated postoperative course  Consults: None  Significant Diagnostic Studies: labs:  CBC    Component Value Date/Time   WBC 8.6 05/24/2018 0522   RBC 4.18 05/24/2018 0522   HGB 10.0 (L) 05/24/2018 0522   HCT 33.8 (L) 05/24/2018 0522   PLT 357 05/24/2018 0522   MCV 80.9 05/24/2018 0522   MCH 23.9 (L) 05/24/2018 0522   MCHC 29.6 (L) 05/24/2018 0522   RDW 22.0 (H) 05/24/2018 0522   LYMPHSABS 1.1 12/20/2016 1527   MONOABS 0.6 12/20/2016 1527   EOSABS 0.1 12/20/2016 1527   BASOSABS 0.1 12/20/2016 1527   BMET    Component Value Date/Time   NA 141 05/24/2018 0522   K 4.0 05/24/2018 0522   CL 104 05/24/2018 0522   CO2 27 05/24/2018 0522   GLUCOSE 135 (H) 05/24/2018 0522   BUN 13 05/24/2018 0522   CREATININE 1.08 (H) 05/24/2018 0522   CALCIUM 10.0 05/24/2018 0522   GFRNONAA >60 05/24/2018 0522   GFRAA >60 05/24/2018 0522    Treatments: surgery: davinci robotic total hysterectomy, bilateral salpingectomy  Discharge Exam: Blood pressure 123/71, pulse 65, temperature 98.5 F (36.9 C), resp. rate 17, height 5' 7.52" (1.715 m), weight 74.9 kg, last menstrual period 05/15/2018, SpO2 100 %. General appearance: alert, cooperative and no distress Back: no tenderness to percussion or palpation Resp: clear to auscultation bilaterally Cardio: regular rate and rhythm, S1, S2 normal and grade 2/6 SEM GI: soft active BS. surgical tender Pelvic: deferred Incision/Wound: well approximated  intact/clean/dry   Disposition: Discharge disposition: 01-Home or Self Care       Discharge Instructions    Call MD for:  persistant nausea and vomiting   Complete by:  As directed    Call MD for:  severe uncontrolled pain   Complete by:  As directed    Call MD for:  temperature >100.4   Complete by:  As directed    Diet - low sodium heart healthy   Complete by:  As directed    Discharge instructions   Complete by:  As directed    Call if temperature greater than equal to 100.4, nothing per vagina for 4-6 weeks or severe nausea vomiting, increased incisional pain , drainage or redness in the incision site, no straining with bowel movements, showers no bath   May shower / Bathe   Complete by:  As directed      Allergies as of 05/24/2018   No Known Allergies     Medication List    TAKE these medications   amLODipine 10 MG tablet Commonly known as:  NORVASC TAKE 1 TABLET BY MOUTH EVERY DAY   HYDROcodone-acetaminophen 5-325 MG tablet Commonly known as:  NORCO/VICODIN Take 1-2 tablets by mouth every 6 (six) hours as needed for moderate pain.   ibuprofen 800 MG tablet Commonly known as:  ADVIL,MOTRIN Take 1 tablet (800 mg total) by mouth every 8 (eight) hours as needed (mild pain).   Iron 15 MG/1.5ML Susp Take 3 mLs (30 mg total) by mouth 3 (three) times daily with meals.        Signed: Cerra Eisenhower  A Amberlyn Martinezgarcia 05/24/2018, 8:10 AM

## 2018-05-24 NOTE — Op Note (Signed)
NAME: Tabitha Matthews, Tabitha Matthews MEDICAL RECORD [id number] ACCOUNT [id number] DATE OF BIRTH:02-17-1971 FACILITY: [facility] LOCATION: [facility] PHYSICIAN:Tamika Shropshire A. Deloras Reichard, MD  OPERATIVE REPORT  DATE OF PROCEDURE:  05/23/2018  PREOPERATIVE DIAGNOSIS:  Symptomatic uterine fibroids.  PROCEDURE:  Da Vinci XI total hysterectomy, bilateral salpingectomy.  POSTOPERATIVE DIAGNOSIS:  Symptomatic uterine fibroids.  ANESTHESIA:  General.  SURGEON:  Servando Salina, MD  ASSISTANT:  Brien Few, MD  DESCRIPTION OF PROCEDURE:  Under adequate general anesthesia, the patient was placed in the dorsal lithotomy position.  She was sterilely prepped and draped in usual fashion.  A 3-way Foley catheter was sterilely placed.  Examination under anesthesia revealed about a 12 to 13 week size uterus.  No adnexal masses could be appreciated.  A weighted speculum in the vagina was placed.  Sims retractor was placed anteriorly.  The cervix was viewed.  Zero Vicryl figure-of-eight sutures were placed on the  anterior and posterior lip of the cervix.  The uterus was then sounded to 9 cm.  A #8 uterine manipulator with a small RUMI cup was introduced into the uterus for manipulation of the uterus.  The retractors were removed and attention was then turned to the abdomen.  Marcaine 0.25% was injected supraumbilical.  Incision was then made.  At the time the incision was then made, it was then noted that the peritoneum was also opened.  The [place] with the camera port was then placed and the camera was  inserted into the abdomen.  Panoramic inspection showed a normal liver edge.  The patient was placed in the Trendelenburg position.  A large wide based uterus was noted.  Additional port sites were then placed, 2 on the right 8 cm apart and 1 on the left the further out and in between that left side was an 8 mm AirSeal cannula.  All these were then placed under direct visualization.  The robot was then docked and in arm #1,  the vessel sealer was placed.  In arm #3 was the long bipolar forceps was placed  and arm #1 monopolar scissors was placed.  I then went to the surgical console.  At the surgical console the pelvis was inspected.  The uterus was elevated.  The procedure was started on the left side.  Ureters were visualized.  The left fallopian tube  was grasped.  The underlying mesosalpinx was serially clamped, cauterized, cut and removed.  The retroperitoneal space on the left was then opened.  A window was placed in the medial aspect of the broad ligament and the left uteroovarian ligament was serially clamped, cauterized and cut.  That then left open the posterior peritoneum, which was further carried down inferiorly and then the round ligament was clamped, cauterized, and cut.  Anteriorly, the anterior leaf of the broad ligament was then carried around to the vesicouterine peritoneum which had some adhesions from prior surgery.  Careful dissection was then performed anteriorly and the bladder was displaced inferiorly.  The uterine vessels were cauterized and then the anterior portion of the cervicovaginal junction was opened transversely and the left uterine vessels were then continued to be serially clamped, cauterized and then cut.  Attention was turned to the opposite side where the same procedure was performed, initially removing  the right fallopian tube, followed by opening the right retroperitoneal space to isolate the right uteroovarian ligament and continue the opening of the vesicouterine peritoneum anteriorly after the round ligament on the right was also clamped, cauterized, and cut.  The uterine vessels were  then clamped, cauterized, and cut.  The remaining cervicovaginal junction was then opened and carried around circumferentially anteriorly and posteriorly with the cervix finally detached.  The uterus was then brought down and with morcellation pulled through the vagina, the entire uterus was then removed.   The vaginal insufflator was then placed.  The left adnexa had a paratubal cyst, which was also removed.  It had been attached to the peritoneum in the lateral wall.  The instruments were then replaced.  Specifically, the vessel sealer, the monopolar scissors, the long tip forceps and the large mega suture needle driver.  A 0 V-Loc suture was then placed.  The vaginal cuff small bleeders were  cauterized.  The bladder was further displaced inferiorly with sharp dissection and the vaginal cuff was closed with a V-Loc running stitch and oversewn with V-Loc as well.  Digital palpation of the vaginal cuff with good approximation.  The procedure was felt to be complete at that point.  The robotic instruments were removed.  The robot was then undocked.  I went back to the patient's bedside.  Bupivacaine was placed over the vaginal cuff area, and the robotic ports were then removed under direct  visualization.  The AirSeal was removed last.  The abdomen was deflated.  The incisions were closed with 4-0 Vicryl subcuticular closure, 0 Vicryl for the fascial incision of the supraumbilical site and closed with 4-0 Vicryl for the skin.  The vaginal cuff was inspected digitally again and good approximation was noted.  SPECIMEN:  Uterus, cervix, fallopian tube, paratubal cyst was sent to pathology.  The weight was 446 grams.  INTRAOPERATIVE FLUIDS:  1100 mL  URINE OUTPUT:  300 mL  ESTIMATED BLOOD LOSS:  30 mL  SPONGE AND INSTRUMENT COUNTS:  x2 was correct.  COMPLICATIONS:  None.  The patient tolerated the procedure well and was transferred to recovery room in stable condition.  TN/NUANCE  D:05/24/2018 T:05/24/2018 JOB:[id number]

## 2018-05-24 NOTE — Telephone Encounter (Signed)
Courtesy refill given until appt on 06/17/18

## 2018-05-24 NOTE — Progress Notes (Signed)
Subjective: Patient reports tolerating PO and no problems voiding.    Objective: I have reviewed patient's vital signs.  vital signs, intake and output and labs. Vitals:   05/23/18 2352 05/24/18 0400  BP: 123/73 123/71  Pulse: 77 65  Resp: 16 17  Temp: 98 F (36.7 C) 98.5 F (36.9 C)  SpO2: 100% 100%   I/O last 3 completed shifts: In: 2477.5 [P.O.:565; I.V.:1712.5; IV Piggyback:200] Out: 1200 [Urine:1150; Blood:50] No intake/output data recorded.  Lab Results  Component Value Date   WBC 8.6 05/24/2018   HGB 10.0 (L) 05/24/2018   HCT 33.8 (L) 05/24/2018   MCV 80.9 05/24/2018   PLT 357 05/24/2018   Lab Results  Component Value Date   CREATININE 1.08 (H) 05/24/2018    EXAM General: alert, cooperative and no distress Resp: clear to auscultation bilaterally Cardio: regular rate and rhythm, S1, S2 normal, no murmur, click, rub or gallop GI: soft, non-tender; bowel sounds normal; no masses,  no organomegaly Extremities: no edema, redness or tenderness in the calves or thighs Vaginal Bleeding: minimal Incision: well approximated Assessment: s/p Procedure(s): XI ROBOTIC ASSISTED LAPAROSCOPIC HYSTERECTOMY AND SALPINGECTOMY: stable, progressing well and tolerating diet  Plan: Encourage ambulation Discontinue IV fluids Discharge home d/c instructions reviewed  Scripts given F/u 2 weeks  LOS: 0 days    Marvene Staff, MD 05/24/2018 8:07 AM    05/24/2018, 8:07 AM

## 2018-05-24 NOTE — Telephone Encounter (Signed)
Copied from [place] [id number]. Topic: Quick Communication - Rx Refill/Question >> May 24, 2018  2:27 PM Selinda Flavin B, NT wrote: Medication: amLODipine (NORVASC) 10 MG tablet   Has the patient contacted their pharmacy? Yes.   (Agent: If no, request that the patient contact the pharmacy for the refill.) (Agent: If yes, when and what did the pharmacy advise?)  Preferred Pharmacy (with phone number or street name): [id number] - [place], [place].  Agent: Please be advised that RX refills may take up to 3 business days. We ask that you follow-up with your pharmacy.

## 2018-05-27 MED ORDER — AMLODIPINE BESYLATE 10 MG PO TABS: 10.0000 mg | ORAL_TABLET | Freq: Every day | ORAL | 0 refills | Status: DC

## 2018-05-27 MED ORDER — IRON 15 MG/1.5ML PO SUSP: 30.0000 mg | Freq: Three times a day (TID) | ORAL | 0 refills | Status: DC

## 2018-05-27 NOTE — Addendum Note (Signed)
Addended by: Aviva Signs M on: 05/27/2018 01:48 PM   Modules accepted: Orders

## 2018-05-27 NOTE — Telephone Encounter (Signed)
Pt contacted and informed rx has been sent as requested.

## 2018-06-06 LAB — HM PAP SMEAR

## 2018-06-17 ENCOUNTER — Ambulatory Visit (INDEPENDENT_AMBULATORY_CARE_PROVIDER_SITE_OTHER): Payer: BC Managed Care – PPO | Admitting: Internal Medicine

## 2018-06-17 ENCOUNTER — Other Ambulatory Visit (INDEPENDENT_AMBULATORY_CARE_PROVIDER_SITE_OTHER): Payer: BC Managed Care – PPO

## 2018-06-17 ENCOUNTER — Encounter: Payer: Self-pay | Admitting: Internal Medicine

## 2018-06-17 VITALS — BP 142/100 | HR 99 | Temp 98.0°F | Resp 16 | Ht 67.25 in | Wt 162.0 lb

## 2018-06-17 DIAGNOSIS — D539 Nutritional anemia, unspecified: Secondary | ICD-10-CM | POA: Insufficient documentation

## 2018-06-17 DIAGNOSIS — Z0000 Encounter for general adult medical examination without abnormal findings: Secondary | ICD-10-CM

## 2018-06-17 DIAGNOSIS — D500 Iron deficiency anemia secondary to blood loss (chronic): Secondary | ICD-10-CM | POA: Diagnosis not present

## 2018-06-17 DIAGNOSIS — E559 Vitamin D deficiency, unspecified: Secondary | ICD-10-CM

## 2018-06-17 DIAGNOSIS — I10 Essential (primary) hypertension: Secondary | ICD-10-CM

## 2018-06-17 DIAGNOSIS — R739 Hyperglycemia, unspecified: Secondary | ICD-10-CM

## 2018-06-17 LAB — URINALYSIS, ROUTINE W REFLEX MICROSCOPIC
Bilirubin Urine: NEGATIVE
KETONES UR: NEGATIVE
LEUKOCYTES UA: NEGATIVE
NITRITE: NEGATIVE
Specific Gravity, Urine: 1.02 (ref 1.000–1.030)
TOTAL PROTEIN, URINE-UPE24: NEGATIVE
URINE GLUCOSE: NEGATIVE
UROBILINOGEN UA: 2 — AB (ref 0.0–1.0)
pH: 6.5 (ref 5.0–8.0)

## 2018-06-17 LAB — TSH: TSH: 2.24 u[IU]/mL (ref 0.35–4.50)

## 2018-06-17 LAB — CBC WITH DIFFERENTIAL/PLATELET
BASOS ABS: 0 10*3/uL (ref 0.0–0.1)
Basophils Relative: 0.8 % (ref 0.0–3.0)
EOS ABS: 0.1 10*3/uL (ref 0.0–0.7)
Eosinophils Relative: 1.4 % (ref 0.0–5.0)
HCT: 35.3 % — ABNORMAL LOW (ref 36.0–46.0)
Hemoglobin: 11.5 g/dL — ABNORMAL LOW (ref 12.0–15.0)
LYMPHS ABS: 1 10*3/uL (ref 0.7–4.0)
LYMPHS PCT: 23 % (ref 12.0–46.0)
MCHC: 32.6 g/dL (ref 30.0–36.0)
MCV: 80.9 fl (ref 78.0–100.0)
Monocytes Absolute: 0.3 10*3/uL (ref 0.1–1.0)
Monocytes Relative: 7.6 % (ref 3.0–12.0)
NEUTROS ABS: 2.9 10*3/uL (ref 1.4–7.7)
NEUTROS PCT: 67.2 % (ref 43.0–77.0)
PLATELETS: 273 10*3/uL (ref 150.0–400.0)
RBC: 4.36 Mil/uL (ref 3.87–5.11)
RDW: 26 % — ABNORMAL HIGH (ref 11.5–15.5)
WBC: 4.3 10*3/uL (ref 4.0–10.5)

## 2018-06-17 LAB — IBC PANEL
IRON: 83 ug/dL (ref 42–145)
SATURATION RATIOS: 22 % (ref 20.0–50.0)
TRANSFERRIN: 270 mg/dL (ref 212.0–360.0)

## 2018-06-17 LAB — LIPID PANEL
CHOLESTEROL: 187 mg/dL (ref 0–200)
HDL: 40 mg/dL (ref >39.00)
LDL Cholesterol: 121 mg/dL — ABNORMAL HIGH (ref 0–99)
NonHDL: 146.64
TRIGLYCERIDES: 130 mg/dL (ref 0.0–149.0)
Total CHOL/HDL Ratio: 5
VLDL: 26 mg/dL (ref 0.0–40.0)

## 2018-06-17 LAB — FERRITIN: Ferritin: 16.7 ng/mL (ref 10.0–291.0)

## 2018-06-17 LAB — HEMOGLOBIN A1C: Hgb A1c MFr Bld: 5.4 % (ref 4.6–6.5)

## 2018-06-17 LAB — VITAMIN D 25 HYDROXY (VIT D DEFICIENCY, FRACTURES): VITD: 11.41 ng/mL — ABNORMAL LOW (ref 30.00–100.00)

## 2018-06-17 MED ORDER — CHLORTHALIDONE 25 MG PO TABS: 25.0000 mg | ORAL_TABLET | Freq: Every day | ORAL | 0 refills | Status: DC

## 2018-06-17 MED ORDER — CHOLECALCIFEROL 1.25 MG (50000 UT) PO CAPS: 50000.0000 [IU] | ORAL_CAPSULE | ORAL | 1 refills | Status: DC

## 2018-06-17 NOTE — Progress Notes (Signed)
Subjective:  Patient ID: Tabitha Matthews, female    DOB: 08-Feb-1971  Age: 47 y.o. MRN: [id number]  CC: Hypertension; Anemia; and Annual Exam   HPI Tabitha Matthews presents for a CPX.  She has a history of iron deficiency anemia caused by heavy menses associated with uterine fibroids.  She is status post hysterectomy and is doing better.  She continues to take the iron supplement.  She is not symptomatic with the anemia.  Denies fatigue, shortness of breath, or paresthesias.  She is concerned that her blood pressure is not well controlled on the amlodipine.  She denies headache, blurred vision, CP, DOE, palpitations, edema, or fatigue.   Outpatient Medications Prior to Visit  Medication Sig Dispense Refill  . amLODipine (NORVASC) 10 MG tablet Take 1 tablet (10 mg total) by mouth daily. 30 tablet 0  . Iron 15 MG/1.5ML SUSP Take 3 mLs (30 mg total) by mouth 3 (three) times daily with meals. 240 mL 0  . HYDROcodone-acetaminophen (NORCO/VICODIN) 5-325 MG tablet Take 1-2 tablets by mouth every 6 (six) hours as needed for moderate pain. 30 tablet 0  . ibuprofen (ADVIL,MOTRIN) 800 MG tablet Take 1 tablet (800 mg total) by mouth every 8 (eight) hours as needed (mild pain). 30 tablet 0   No facility-administered medications prior to visit.     ROS Review of Systems  Constitutional: Negative.  Negative for appetite change, diaphoresis, fatigue and unexpected weight change.  HENT: Negative.  Negative for trouble swallowing.   Eyes: Negative for visual disturbance.  Respiratory: Negative for cough, chest tightness, shortness of breath and wheezing.   Cardiovascular: Negative for chest pain, palpitations and leg swelling.  Gastrointestinal: Negative for abdominal pain, constipation, diarrhea, nausea and vomiting.  Endocrine: Negative.   Genitourinary: Negative.  Negative for difficulty urinating and vaginal bleeding.  Musculoskeletal: Negative.  Negative for arthralgias and myalgias.  Skin:  Negative.  Negative for color change and pallor.  Neurological: Negative.  Negative for dizziness, weakness, light-headedness and headaches.  Hematological: Negative for adenopathy. Does not bruise/bleed easily.  Psychiatric/Behavioral: Negative.     Objective:  BP (!) 142/100   Pulse 99   Temp 98 F (36.7 C) (Oral)   Resp 16   Ht 5' 7.25" (1.708 m)   Wt 162 lb (73.5 kg)   LMP 05/15/2018 (Exact Date)   SpO2 99%   BMI 25.18 kg/m   BP Readings from Last 3 Encounters:  06/17/18 (!) 142/100  05/24/18 120/76  05/18/18 (!) 147/92    Wt Readings from Last 3 Encounters:  06/17/18 162 lb (73.5 kg)  05/23/18 165 lb 2 oz (74.9 kg)  05/17/18 165 lb 2 oz (74.9 kg)    Physical Exam  Constitutional: She is oriented to person, place, and time. No distress.  HENT:  Mouth/Throat: Oropharynx is clear and moist. No oropharyngeal exudate.  Eyes: Conjunctivae are normal. No scleral icterus.  Neck: Normal range of motion. Neck supple. No JVD present. No thyromegaly present.  Cardiovascular: Normal rate, regular rhythm and normal heart sounds. Exam reveals no gallop.  No murmur heard. Pulmonary/Chest: Effort normal and breath sounds normal. No respiratory distress. She has no wheezes. She has no rales.  Abdominal: Soft. Bowel sounds are normal. She exhibits no mass. There is no hepatosplenomegaly. There is no tenderness.  Musculoskeletal: Normal range of motion. She exhibits no edema, tenderness or deformity.  Lymphadenopathy:    She has no cervical adenopathy.  Neurological: She is alert and oriented to person, place, and  time.  Skin: Skin is warm and dry. She is not diaphoretic. No pallor.  Vitals reviewed.   Lab Results  Component Value Date   WBC 4.3 06/17/2018   HGB 11.5 (L) 06/17/2018   HCT 35.3 (L) 06/17/2018   PLT 273.0 06/17/2018   GLUCOSE 135 (H) 05/24/2018   CHOL 187 06/17/2018   TRIG 130.0 06/17/2018   HDL 40.00 06/17/2018   LDLCALC 121 (H) 06/17/2018   ALT 7  12/20/2016   AST 12 12/20/2016   NA 141 05/24/2018   K 4.0 05/24/2018   CL 104 05/24/2018   CREATININE 1.08 (H) 05/24/2018   BUN 13 05/24/2018   CO2 27 05/24/2018   TSH 2.24 06/17/2018   HGBA1C 5.4 06/17/2018    No results found.  Assessment & Plan:   Deisha was seen today for hypertension, anemia and annual exam.  Diagnoses and all orders for this visit:  Iron deficiency anemia due to chronic blood loss- Her H&H have improved but she is still anemic.  Her iron level is normal.  I will pursue other causes of anemia. -     CBC with Differential/Platelet; Future -     IBC panel; Future -     Ferritin; Future  Essential hypertension- Her blood pressure is not well controlled on the CCB.  I will treat the vitamin D deficiency and will start a thiazide diuretic.  Her labs are negative for secondary causes or endorgan damage. -     TSH; Future -     Urinalysis, Routine w reflex microscopic; Future -     chlorthalidone (HYGROTON) 25 MG tablet; Take 1 tablet (25 mg total) by mouth daily.  Hyperglycemia- Her A1c is down to 5.4%.  Medical therapy is not indicated. -     Hemoglobin A1c; Future  Vitamin D deficiency -     VITAMIN D 25 Hydroxy (Vit-D Deficiency, Fractures); Future -     Cholecalciferol 50000 units capsule; Take 1 capsule (50,000 Units total) by mouth once a week.  Routine general medical examination at a health care facility- Exam completed, labs reviewed, she refused a flu vaccine, mammogram and Pap smear up-to-date, patient education material was given. -     Lipid panel; Future  Deficiency anemia- I will screen her for B12 and folate deficiency. -     Vitamin B12; Future -     Folate; Future   I have discontinued Ryian S. Keng's ibuprofen and HYDROcodone-acetaminophen. I am also having her start on Cholecalciferol and chlorthalidone. Additionally, I am having her maintain her Iron and amLODipine.  Meds ordered this encounter  Medications  . Cholecalciferol  50000 units capsule    Sig: Take 1 capsule (50,000 Units total) by mouth once a week.    Dispense:  12 capsule    Refill:  1  . chlorthalidone (HYGROTON) 25 MG tablet    Sig: Take 1 tablet (25 mg total) by mouth daily.    Dispense:  90 tablet    Refill:  0     Follow-up: Return in about 3 months (around 09/17/2018).  Scarlette Calico, MD

## 2018-06-17 NOTE — Patient Instructions (Signed)

## 2018-06-18 ENCOUNTER — Other Ambulatory Visit: Payer: Self-pay | Admitting: Internal Medicine

## 2018-06-18 ENCOUNTER — Encounter: Payer: Self-pay | Admitting: Internal Medicine

## 2018-06-18 ENCOUNTER — Other Ambulatory Visit (INDEPENDENT_AMBULATORY_CARE_PROVIDER_SITE_OTHER): Payer: BC Managed Care – PPO

## 2018-06-18 DIAGNOSIS — D539 Nutritional anemia, unspecified: Secondary | ICD-10-CM

## 2018-06-18 DIAGNOSIS — D500 Iron deficiency anemia secondary to blood loss (chronic): Secondary | ICD-10-CM

## 2018-06-18 LAB — FOLATE: FOLATE: 19 ng/mL (ref >5.9)

## 2018-06-18 LAB — VITAMIN B12: VITAMIN B 12: 309 pg/mL (ref 211–911)

## 2018-06-20 ENCOUNTER — Other Ambulatory Visit: Payer: Self-pay | Admitting: Internal Medicine

## 2018-06-20 ENCOUNTER — Telehealth: Payer: Self-pay | Admitting: *Deleted

## 2018-06-20 DIAGNOSIS — D500 Iron deficiency anemia secondary to blood loss (chronic): Secondary | ICD-10-CM

## 2018-06-20 MED ORDER — FERROUS SULFATE 325 (65 FE) MG PO TABS: 325.0000 mg | ORAL_TABLET | Freq: Every day | ORAL | 1 refills | Status: DC

## 2018-06-20 NOTE — Telephone Encounter (Signed)
Patient states she would like to switch to iron tablet/caspsule if possible as oppose to taking the liquid three times a day. It is very hard to get it down. Please advise.

## 2018-07-04 ENCOUNTER — Other Ambulatory Visit: Payer: BC Managed Care – PPO

## 2018-07-04 DIAGNOSIS — D539 Nutritional anemia, unspecified: Secondary | ICD-10-CM

## 2018-07-11 LAB — VITAMIN B1: Vitamin B1 (Thiamine): 10 nmol/L (ref 8–30)

## 2018-09-12 ENCOUNTER — Other Ambulatory Visit: Payer: Self-pay | Admitting: Internal Medicine

## 2018-09-12 DIAGNOSIS — I10 Essential (primary) hypertension: Secondary | ICD-10-CM

## 2018-12-13 ENCOUNTER — Other Ambulatory Visit: Payer: Self-pay | Admitting: Internal Medicine

## 2018-12-13 DIAGNOSIS — E559 Vitamin D deficiency, unspecified: Secondary | ICD-10-CM

## 2018-12-17 ENCOUNTER — Other Ambulatory Visit: Payer: Self-pay | Admitting: Internal Medicine

## 2018-12-17 DIAGNOSIS — I10 Essential (primary) hypertension: Secondary | ICD-10-CM

## 2018-12-18 ENCOUNTER — Other Ambulatory Visit: Payer: Self-pay | Admitting: Internal Medicine

## 2019-03-11 ENCOUNTER — Other Ambulatory Visit: Payer: Self-pay | Admitting: Internal Medicine

## 2019-03-11 DIAGNOSIS — E559 Vitamin D deficiency, unspecified: Secondary | ICD-10-CM

## 2019-03-17 ENCOUNTER — Other Ambulatory Visit: Payer: Self-pay | Admitting: Internal Medicine

## 2019-03-17 DIAGNOSIS — I10 Essential (primary) hypertension: Secondary | ICD-10-CM

## 2019-04-12 ENCOUNTER — Other Ambulatory Visit: Payer: Self-pay | Admitting: Internal Medicine

## 2019-04-12 DIAGNOSIS — I10 Essential (primary) hypertension: Secondary | ICD-10-CM

## 2019-04-13 ENCOUNTER — Other Ambulatory Visit: Payer: Self-pay | Admitting: Internal Medicine

## 2019-06-15 ENCOUNTER — Other Ambulatory Visit: Payer: Self-pay | Admitting: Internal Medicine

## 2019-10-07 ENCOUNTER — Ambulatory Visit: Payer: BC Managed Care – PPO | Admitting: Internal Medicine

## 2019-10-13 ENCOUNTER — Ambulatory Visit: Payer: BC Managed Care – PPO | Admitting: Internal Medicine

## 2019-11-03 ENCOUNTER — Other Ambulatory Visit: Payer: Self-pay

## 2019-11-03 ENCOUNTER — Encounter: Payer: Self-pay | Admitting: Internal Medicine

## 2019-11-03 ENCOUNTER — Other Ambulatory Visit: Payer: Self-pay | Admitting: Internal Medicine

## 2019-11-03 ENCOUNTER — Ambulatory Visit: Payer: BC Managed Care – PPO | Admitting: Internal Medicine

## 2019-11-03 VITALS — BP 136/86 | HR 75 | Temp 98.0°F | Resp 16 | Ht 67.25 in | Wt 178.0 lb

## 2019-11-03 DIAGNOSIS — Z0000 Encounter for general adult medical examination without abnormal findings: Secondary | ICD-10-CM | POA: Diagnosis not present

## 2019-11-03 DIAGNOSIS — Z1211 Encounter for screening for malignant neoplasm of colon: Secondary | ICD-10-CM

## 2019-11-03 DIAGNOSIS — R739 Hyperglycemia, unspecified: Secondary | ICD-10-CM | POA: Diagnosis not present

## 2019-11-03 DIAGNOSIS — I10 Essential (primary) hypertension: Secondary | ICD-10-CM

## 2019-11-03 DIAGNOSIS — D500 Iron deficiency anemia secondary to blood loss (chronic): Secondary | ICD-10-CM | POA: Diagnosis not present

## 2019-11-03 DIAGNOSIS — T502X5A Adverse effect of carbonic-anhydrase inhibitors, benzothiadiazides and other diuretics, initial encounter: Secondary | ICD-10-CM

## 2019-11-03 DIAGNOSIS — E559 Vitamin D deficiency, unspecified: Secondary | ICD-10-CM

## 2019-11-03 DIAGNOSIS — Z1231 Encounter for screening mammogram for malignant neoplasm of breast: Secondary | ICD-10-CM

## 2019-11-03 DIAGNOSIS — E876 Hypokalemia: Secondary | ICD-10-CM

## 2019-11-03 LAB — HEPATIC FUNCTION PANEL
ALT: 11 U/L (ref 0–35)
AST: 16 U/L (ref 0–37)
Albumin: 4.3 g/dL (ref 3.5–5.2)
Alkaline Phosphatase: 68 U/L (ref 39–117)
Bilirubin, Direct: 0.1 mg/dL (ref 0.0–0.3)
Total Bilirubin: 1 mg/dL (ref 0.2–1.2)
Total Protein: 8.4 g/dL — ABNORMAL HIGH (ref 6.0–8.3)

## 2019-11-03 LAB — CBC WITH DIFFERENTIAL/PLATELET
Basophils Absolute: 0 10*3/uL (ref 0.0–0.1)
Basophils Relative: 0.8 % (ref 0.0–3.0)
Eosinophils Absolute: 0 10*3/uL (ref 0.0–0.7)
Eosinophils Relative: 0.9 % (ref 0.0–5.0)
HCT: 36 % (ref 36.0–46.0)
Hemoglobin: 12.5 g/dL (ref 12.0–15.0)
Lymphocytes Relative: 25.4 % (ref 12.0–46.0)
Lymphs Abs: 1.2 10*3/uL (ref 0.7–4.0)
MCHC: 34.9 g/dL (ref 30.0–36.0)
MCV: 95 fl (ref 78.0–100.0)
Monocytes Absolute: 0.4 10*3/uL (ref 0.1–1.0)
Monocytes Relative: 9 % (ref 3.0–12.0)
Neutro Abs: 3 10*3/uL (ref 1.4–7.7)
Neutrophils Relative %: 63.9 % (ref 43.0–77.0)
Platelets: 319 10*3/uL (ref 150.0–400.0)
RBC: 3.79 Mil/uL — ABNORMAL LOW (ref 3.87–5.11)
RDW: 12.7 % (ref 11.5–15.5)
WBC: 4.7 10*3/uL (ref 4.0–10.5)

## 2019-11-03 LAB — IBC PANEL
Iron: 79 ug/dL (ref 42–145)
Saturation Ratios: 25.6 % (ref 20.0–50.0)
Transferrin: 220 mg/dL (ref 212.0–360.0)

## 2019-11-03 LAB — LIPID PANEL
Cholesterol: 192 mg/dL (ref 0–200)
HDL: 39.5 mg/dL (ref >39.00)
LDL Cholesterol: 132 mg/dL — ABNORMAL HIGH (ref 0–99)
NonHDL: 152
Total CHOL/HDL Ratio: 5
Triglycerides: 101 mg/dL (ref 0.0–149.0)
VLDL: 20.2 mg/dL (ref 0.0–40.0)

## 2019-11-03 LAB — BASIC METABOLIC PANEL
BUN: 10 mg/dL (ref 6–23)
CO2: 31 mEq/L (ref 19–32)
Calcium: 10.5 mg/dL (ref 8.4–10.5)
Chloride: 99 mEq/L (ref 96–112)
Creatinine, Ser: 0.87 mg/dL (ref 0.40–1.20)
GFR: 83.59 mL/min (ref >60.00)
Glucose, Bld: 105 mg/dL — ABNORMAL HIGH (ref 70–99)
Potassium: 3.4 mEq/L — ABNORMAL LOW (ref 3.5–5.1)
Sodium: 138 mEq/L (ref 135–145)

## 2019-11-03 LAB — FERRITIN: Ferritin: 32.8 ng/mL (ref 10.0–291.0)

## 2019-11-03 LAB — TSH: TSH: 2.39 u[IU]/mL (ref 0.35–4.50)

## 2019-11-03 LAB — HEMOGLOBIN A1C: Hgb A1c MFr Bld: 5.4 % (ref 4.6–6.5)

## 2019-11-03 LAB — VITAMIN D 25 HYDROXY (VIT D DEFICIENCY, FRACTURES): VITD: 19.21 ng/mL — ABNORMAL LOW (ref 30.00–100.00)

## 2019-11-03 MED ORDER — CHLORTHALIDONE 25 MG PO TABS: 25.0000 mg | ORAL_TABLET | Freq: Every day | ORAL | 0 refills | Status: DC

## 2019-11-03 MED ORDER — VITAMIN D3 1.25 MG (50000 UT) PO CAPS: 1.0000 | ORAL_CAPSULE | ORAL | 0 refills | Status: DC

## 2019-11-03 MED ORDER — POTASSIUM CHLORIDE CRYS ER 20 MEQ PO TBCR: 20.0000 meq | EXTENDED_RELEASE_TABLET | Freq: Two times a day (BID) | ORAL | 1 refills | Status: DC

## 2019-11-03 NOTE — Patient Instructions (Signed)
Health Maintenance, Female Adopting a healthy lifestyle and getting preventive care are important in promoting health and wellness. Ask your health care provider about:  The right schedule for you to have regular tests and exams.  Things you can do on your own to prevent diseases and keep yourself healthy. What should I know about diet, weight, and exercise? Eat a healthy diet   Eat a diet that includes plenty of vegetables, fruits, low-fat dairy products, and lean protein.  Do not eat a lot of foods that are high in solid fats, added sugars, or sodium. Maintain a healthy weight Body mass index (BMI) is used to identify weight problems. It estimates body fat based on height and weight. Your health care provider can help determine your BMI and help you achieve or maintain a healthy weight. Get regular exercise Get regular exercise. This is one of the most important things you can do for your health. Most adults should:  Exercise for at least 150 minutes each week. The exercise should increase your heart rate and make you sweat (moderate-intensity exercise).  Do strengthening exercises at least twice a week. This is in addition to the moderate-intensity exercise.  Spend less time sitting. Even light physical activity can be beneficial. Watch cholesterol and blood lipids Have your blood tested for lipids and cholesterol at 50 years of age, then have this test every 5 years. Have your cholesterol levels checked more often if:  Your lipid or cholesterol levels are high.  You are older than 50 years of age.  You are at high risk for heart disease. What should I know about cancer screening? Depending on your health history and family history, you may need to have cancer screening at various ages. This may include screening for:  Breast cancer.  Cervical cancer.  Colorectal cancer.  Skin cancer.  Lung cancer. What should I know about heart disease, diabetes, and high blood  pressure? Blood pressure and heart disease  High blood pressure causes heart disease and increases the risk of stroke. This is more likely to develop in people who have high blood pressure readings, are of African descent, or are overweight.  Have your blood pressure checked: ? Every 3-5 years if you are 18-39 years of age. ? Every year if you are 40 years old or older. Diabetes Have regular diabetes screenings. This checks your fasting blood sugar level. Have the screening done:  Once every three years after age 40 if you are at a normal weight and have a low risk for diabetes.  More often and at a younger age if you are overweight or have a high risk for diabetes. What should I know about preventing infection? Hepatitis B If you have a higher risk for hepatitis B, you should be screened for this virus. Talk with your health care provider to find out if you are at risk for hepatitis B infection. Hepatitis C Testing is recommended for:  Everyone born from 1945 through 1965.  Anyone with known risk factors for hepatitis C. Sexually transmitted infections (STIs)  Get screened for STIs, including gonorrhea and chlamydia, if: ? You are sexually active and are younger than 50 years of age. ? You are older than 50 years of age and your health care provider tells you that you are at risk for this type of infection. ? Your sexual activity has changed since you were last screened, and you are at increased risk for chlamydia or gonorrhea. Ask your health care provider if   you are at risk.  Ask your health care provider about whether you are at high risk for HIV. Your health care provider may recommend a prescription medicine to help prevent HIV infection. If you choose to take medicine to prevent HIV, you should first get tested for HIV. You should then be tested every 3 months for as long as you are taking the medicine. Pregnancy  If you are about to stop having your period (premenopausal) and  you may become pregnant, seek counseling before you get pregnant.  Take 400 to 800 micrograms (mcg) of folic acid every day if you become pregnant.  Ask for birth control (contraception) if you want to prevent pregnancy. Osteoporosis and menopause Osteoporosis is a disease in which the bones lose minerals and strength with aging. This can result in bone fractures. If you are 65 years old or older, or if you are at risk for osteoporosis and fractures, ask your health care provider if you should:  Be screened for bone loss.  Take a calcium or vitamin D supplement to lower your risk of fractures.  Be given hormone replacement therapy (HRT) to treat symptoms of menopause. Follow these instructions at home: Lifestyle  Do not use any products that contain nicotine or tobacco, such as cigarettes, e-cigarettes, and chewing tobacco. If you need help quitting, ask your health care provider.  Do not use street drugs.  Do not share needles.  Ask your health care provider for help if you need support or information about quitting drugs. Alcohol use  Do not drink alcohol if: ? Your health care provider tells you not to drink. ? You are pregnant, may be pregnant, or are planning to become pregnant.  If you drink alcohol: ? Limit how much you use to 0-1 drink a day. ? Limit intake if you are breastfeeding.  Be aware of how much alcohol is in your drink. In the [place] one drink equals one 12 oz bottle of beer (355 mL), one 5 oz glass of wine (148 mL), or one 1 oz glass of hard liquor (44 mL). General instructions  Schedule regular health, dental, and eye exams.  Stay current with your vaccines.  Tell your health care provider if: ? You often feel depressed. ? You have ever been abused or do not feel safe at home. Summary  Adopting a healthy lifestyle and getting preventive care are important in promoting health and wellness.  Follow your health care provider's instructions about healthy  diet, exercising, and getting tested or screened for diseases.  Follow your health care provider's instructions on monitoring your cholesterol and blood pressure. This information is not intended to replace advice given to you by your health care provider. Make sure you discuss any questions you have with your health care provider. Document Revised: 07/31/2018 Document Reviewed: 07/31/2018 Elsevier Patient Education  2020 [employer] Inc[employer]  

## 2019-11-03 NOTE — Progress Notes (Signed)
Subjective:  Patient ID: Tabitha Matthews, female    DOB: [birth year]  Age: 49 y.o. MRN: [id number]  CC: Hypertension, Annual Exam, and Anemia  This visit occurred during the SARS-CoV-2 public health emergency.  Safety protocols were in place, including screening questions prior to the visit, additional usage of staff PPE, and extensive cleaning of exam room while observing appropriate contact time as indicated for disinfecting solutions.   HPI Tabitha Matthews presents for a CPX.  She tells me her blood pressure has been well controlled.  She has recently been taking chlorthalidone but not amlodipine.  She denies headache, blurred vision, chest pain, shortness of breath, edema, or fatigue.  She has not been working on her lifestyle modifications and complains of weight gain.  Outpatient Medications Prior to Visit  Medication Sig Dispense Refill  . escitalopram (LEXAPRO) 20 MG tablet Take 20 mg by mouth daily.    . ferrous sulfate 325 (65 FE) MG tablet Take 1 tablet (325 mg total) by mouth daily with breakfast. 90 tablet 1  . amLODipine (NORVASC) 10 MG tablet TAKE 1 TABLET BY MOUTH EVERY DAY 90 tablet 1  . chlorthalidone (HYGROTON) 25 MG tablet TAKE 1 TABLET BY MOUTH EVERY DAY 90 tablet 0  . Cholecalciferol (VITAMIN D3) 1.25 MG (50000 UT) CAPS TAKE 1 CAPSULE BY MOUTH ONE TIME PER WEEK 12 capsule 0   No facility-administered medications prior to visit.    ROS Review of Systems  Constitutional: Positive for unexpected weight change (wt gain). Negative for diaphoresis and fatigue.  HENT: Negative.   Eyes: Negative for visual disturbance.  Respiratory: Negative for cough, chest tightness, shortness of breath and wheezing.   Cardiovascular: Negative for chest pain, palpitations and leg swelling.  Gastrointestinal: Negative for abdominal pain, constipation, diarrhea, nausea and vomiting.  Endocrine: Negative.   Genitourinary: Negative.  Negative for difficulty urinating.  Musculoskeletal:  Negative for arthralgias and myalgias.  Skin: Negative.  Negative for color change and pallor.  Neurological: Negative.  Negative for dizziness, weakness, light-headedness and headaches.  Hematological: Negative for adenopathy. Does not bruise/bleed easily.  Psychiatric/Behavioral: Negative.     Objective:  BP 136/86 (BP Location: Left Arm, Patient Position: Sitting, Cuff Size: Large)   Pulse 75   Temp 98 F (36.7 C) (Oral)   Resp 16   Ht 5' 7.25" (1.708 m)   Wt 178 lb (80.7 kg)   LMP 05/15/2018 (Exact Date)   SpO2 98%   BMI 27.67 kg/m   BP Readings from Last 3 Encounters:  11/03/19 136/86  06/17/18 (!) 142/100  05/24/18 120/76    Wt Readings from Last 3 Encounters:  11/03/19 178 lb (80.7 kg)  06/17/18 162 lb (73.5 kg)  05/23/18 165 lb 2 oz (74.9 kg)    Physical Exam Vitals reviewed.  Constitutional:      Appearance: Normal appearance.  HENT:     Nose: Nose normal.     Mouth/Throat:     Mouth: Mucous membranes are moist.  Eyes:     General: No scleral icterus.    Conjunctiva/sclera: Conjunctivae normal.  Cardiovascular:     Rate and Rhythm: Normal rate and regular rhythm.     Heart sounds: No murmur.  Pulmonary:     Effort: Pulmonary effort is normal.     Breath sounds: No stridor. No wheezing, rhonchi or rales.  Abdominal:     General: Abdomen is flat. Bowel sounds are normal. There is no distension.     Palpations: Abdomen is soft.  There is no hepatomegaly, splenomegaly or mass.     Tenderness: There is no abdominal tenderness.  Musculoskeletal:        General: Normal range of motion.     Cervical back: Neck supple.     Right lower leg: No edema.     Left lower leg: No edema.  Lymphadenopathy:     Cervical: No cervical adenopathy.  Skin:    General: Skin is warm and dry.     Coloration: Skin is not pale.  Neurological:     General: No focal deficit present.     Mental Status: She is alert.  Psychiatric:        Mood and Affect: Mood normal.         Behavior: Behavior normal.     Lab Results  Component Value Date   WBC 4.7 11/03/2019   HGB 12.5 11/03/2019   HCT 36.0 11/03/2019   PLT 319.0 11/03/2019   GLUCOSE 105 (H) 11/03/2019   CHOL 192 11/03/2019   TRIG 101.0 11/03/2019   HDL 39.50 11/03/2019   LDLCALC 132 (H) 11/03/2019   ALT 11 11/03/2019   AST 16 11/03/2019   NA 138 11/03/2019   K 3.4 (L) 11/03/2019   CL 99 11/03/2019   CREATININE 0.87 11/03/2019   BUN 10 11/03/2019   CO2 31 11/03/2019   TSH 2.39 11/03/2019   HGBA1C 5.4 11/03/2019    No results found.  Assessment & Plan:   Maize was seen today for hypertension, annual exam and anemia.  Diagnoses and all orders for this visit:  Essential hypertension-her blood pressure is not quite adequately well controlled.  Her labs are negative for secondary causes or endorgan damage.  I have asked her to take the chlorthalidone consistently and to improve her lifestyle modifications. -     TSH -     chlorthalidone (HYGROTON) 25 MG tablet; Take 1 tablet (25 mg total) by mouth daily.  Iron deficiency anemia due to chronic blood loss-her H&H are normal now. -     IBC panel -     CBC with Differential/Platelet -     Ferritin  Routine general medical examination at a health care facility-exam completed, labs reviewed, vaccines reviewed and updated, she is referred for screening mammogram and colon cancer screening, cervical cancer screening is up-to-date, patient education was given. -     Lipid panel -     HIV Antibody (routine testing w rflx)  Vitamin D deficiency disease -     VITAMIN D 25 Hydroxy (Vit-D Deficiency, Fractures) -     Cholecalciferol (VITAMIN D3) 1.25 MG (50000 UT) CAPS; Take 1 capsule by mouth once a week.  Hyperglycemia- Her A1C is normal. -     Basic metabolic panel -     Hepatic function panel -     Hemoglobin A1c  Visit for screening mammogram -     MM DIGITAL SCREENING BILATERAL; Future  Vitamin D deficiency  Diuretic-induced  hypokalemia- This is most likely related to the chlorthalidone.  I have asked her to start taking a potassium supplement. -     potassium chloride SA (KLOR-CON) 20 MEQ tablet; Take 1 tablet (20 mEq total) by mouth 2 (two) times daily.   I have discontinued Ilham S. Eno's amLODipine. I have also changed her Vitamin D3 and chlorthalidone. Additionally, I am having her start on potassium chloride SA. Lastly, I am having her maintain her ferrous sulfate and escitalopram.  Meds ordered this encounter  Medications  .  Cholecalciferol (VITAMIN D3) 1.25 MG (50000 UT) CAPS    Sig: Take 1 capsule by mouth once a week.    Dispense:  12 capsule    Refill:  0  . potassium chloride SA (KLOR-CON) 20 MEQ tablet    Sig: Take 1 tablet (20 mEq total) by mouth 2 (two) times daily.    Dispense:  180 tablet    Refill:  1  . chlorthalidone (HYGROTON) 25 MG tablet    Sig: Take 1 tablet (25 mg total) by mouth daily.    Dispense:  90 tablet    Refill:  0     Follow-up: Return in about 6 months (around 05/05/2020).  Scarlette Calico, MD

## 2019-11-04 ENCOUNTER — Encounter: Payer: Self-pay | Admitting: Internal Medicine

## 2019-11-04 LAB — HIV ANTIBODY (ROUTINE TESTING W REFLEX): HIV 1&2 Ab, 4th Generation: NONREACTIVE

## 2019-11-04 NOTE — Addendum Note (Signed)
Addended by: Karle Barr on: 11/04/2019 02:39 PM   Modules accepted: Orders

## 2019-12-05 ENCOUNTER — Ambulatory Visit
Admission: RE | Admit: 2019-12-05 | Discharge: 2019-12-05 | Disposition: A | Discharge status: Home / Self Care | Payer: BC Managed Care – PPO | Source: Ambulatory Visit | Attending: Internal Medicine | Admitting: Internal Medicine

## 2019-12-05 ENCOUNTER — Other Ambulatory Visit: Payer: Self-pay

## 2019-12-05 DIAGNOSIS — Z1231 Encounter for screening mammogram for malignant neoplasm of breast: Secondary | ICD-10-CM

## 2019-12-05 LAB — HM MAMMOGRAPHY

## 2020-06-05 ENCOUNTER — Other Ambulatory Visit: Payer: Self-pay | Admitting: Internal Medicine

## 2020-06-05 DIAGNOSIS — I10 Essential (primary) hypertension: Secondary | ICD-10-CM

## 2020-06-25 ENCOUNTER — Telehealth: Payer: Self-pay

## 2020-06-25 NOTE — Telephone Encounter (Signed)
Pt states she did not do the kit b/c it was never discussed with her. Pt educated on the cologuard kit as a colon cancer screening that she can do privately in her home since she was 49y/o with average risk factors.  Pt verb understanding & request another kit be sent to her home.  Will reach out to cologuard to arrange another kit to be sent to her home.  Pt also request refills of Norvasc & chlorthalidone.Marland Kitchen

## 2020-06-25 NOTE — Telephone Encounter (Signed)
LVM instructing pt to schedule appt for BP check & labs so that she may get her medications refilled.

## 2020-06-28 NOTE — Telephone Encounter (Signed)
LVM instructing patient to call to schedule appt with Dr Ronnald Ramp to get requested refills. Also if patient calls back & I'm not available, she may call [employer] support at [phone] to verify shipping info for Cologuard & they will send another out, free of charge.

## 2020-09-21 ENCOUNTER — Telehealth: Payer: Self-pay | Admitting: Internal Medicine

## 2020-09-21 ENCOUNTER — Ambulatory Visit: Payer: BC Managed Care – PPO | Admitting: Internal Medicine

## 2020-09-21 DIAGNOSIS — I10 Essential (primary) hypertension: Secondary | ICD-10-CM

## 2020-09-21 MED ORDER — CHLORTHALIDONE 25 MG PO TABS: 25.0000 mg | ORAL_TABLET | Freq: Every day | ORAL | 0 refills | Status: DC

## 2020-09-21 NOTE — Telephone Encounter (Signed)
   Patient requesting short supply of blood pressure medication until appointment 2/4 chlorthalidone (HYGROTON) 25 MG tablet to [id number] - [place], [place] - [address]

## 2020-09-24 ENCOUNTER — Encounter: Payer: Self-pay | Admitting: Internal Medicine

## 2020-09-24 ENCOUNTER — Other Ambulatory Visit: Payer: Self-pay

## 2020-09-24 ENCOUNTER — Ambulatory Visit: Payer: BC Managed Care – PPO | Admitting: Internal Medicine

## 2020-09-24 DIAGNOSIS — R739 Hyperglycemia, unspecified: Secondary | ICD-10-CM | POA: Diagnosis not present

## 2020-09-24 DIAGNOSIS — D500 Iron deficiency anemia secondary to blood loss (chronic): Secondary | ICD-10-CM | POA: Diagnosis not present

## 2020-09-24 DIAGNOSIS — I10 Essential (primary) hypertension: Secondary | ICD-10-CM | POA: Diagnosis not present

## 2020-09-24 LAB — LIPID PANEL
Cholesterol: 190 mg/dL (ref 0–200)
HDL: 39.7 mg/dL (ref >39.00)
LDL Cholesterol: 122 mg/dL — ABNORMAL HIGH (ref 0–99)
NonHDL: 150.52
Total CHOL/HDL Ratio: 5
Triglycerides: 142 mg/dL (ref 0.0–149.0)
VLDL: 28.4 mg/dL (ref 0.0–40.0)

## 2020-09-24 LAB — CBC
HCT: 37 % (ref 36.0–46.0)
Hemoglobin: 12.9 g/dL (ref 12.0–15.0)
MCHC: 34.8 g/dL (ref 30.0–36.0)
MCV: 92.2 fl (ref 78.0–100.0)
Platelets: 327 10*3/uL (ref 150.0–400.0)
RBC: 4.01 Mil/uL (ref 3.87–5.11)
RDW: 13 % (ref 11.5–15.5)
WBC: 5.7 10*3/uL (ref 4.0–10.5)

## 2020-09-24 LAB — COMPREHENSIVE METABOLIC PANEL
ALT: 12 U/L (ref 0–35)
AST: 15 U/L (ref 0–37)
Albumin: 4.4 g/dL (ref 3.5–5.2)
Alkaline Phosphatase: 69 U/L (ref 39–117)
BUN: 13 mg/dL (ref 6–23)
CO2: 30 mEq/L (ref 19–32)
Calcium: 10.3 mg/dL (ref 8.4–10.5)
Chloride: 98 mEq/L (ref 96–112)
Creatinine, Ser: 1.09 mg/dL (ref 0.40–1.20)
GFR: 59.32 mL/min — ABNORMAL LOW (ref >60.00)
Glucose, Bld: 127 mg/dL — ABNORMAL HIGH (ref 70–99)
Potassium: 3.1 mEq/L — ABNORMAL LOW (ref 3.5–5.1)
Sodium: 135 mEq/L (ref 135–145)
Total Bilirubin: 1.2 mg/dL (ref 0.2–1.2)
Total Protein: 8.4 g/dL — ABNORMAL HIGH (ref 6.0–8.3)

## 2020-09-24 LAB — HEMOGLOBIN A1C: Hgb A1c MFr Bld: 5.5 % (ref 4.6–6.5)

## 2020-09-24 MED ORDER — CHLORTHALIDONE 25 MG PO TABS: 25.0000 mg | ORAL_TABLET | Freq: Every day | ORAL | 3 refills | Status: DC

## 2020-09-24 MED ORDER — AMLODIPINE BESYLATE 10 MG PO TABS: 10.0000 mg | ORAL_TABLET | Freq: Every day | ORAL | 3 refills | Status: DC

## 2020-09-24 NOTE — Progress Notes (Signed)
   Subjective:   Patient ID: Tabitha Matthews, female    DOB: [birth year], 49 y.o.   MRN: [id number]  HPI The patient is a 51 YO female coming in for blood pressure follow up. She has been taking amlodipine 10 mg daily and chlorthalidone 25 mg daily. BP has been doing well. Somehow this has been off her medication list the amlodipine. She denies chest pains or headaches. Denies current exercise. Would prefer 90 day refills to help with her remembering to fill this.  Review of Systems  Constitutional: Negative.   HENT: Negative.   Eyes: Negative.   Respiratory: Negative for cough, chest tightness and shortness of breath.   Cardiovascular: Negative for chest pain, palpitations and leg swelling.  Gastrointestinal: Negative for abdominal distention, abdominal pain, constipation, diarrhea, nausea and vomiting.  Musculoskeletal: Negative.   Skin: Negative.   Neurological: Negative.   Psychiatric/Behavioral: Negative.     Objective:  Physical Exam Constitutional:      Appearance: She is well-developed and well-nourished.  HENT:     Head: Normocephalic and atraumatic.  Eyes:     Extraocular Movements: EOM normal.  Cardiovascular:     Rate and Rhythm: Normal rate and regular rhythm.  Pulmonary:     Effort: Pulmonary effort is normal. No respiratory distress.     Breath sounds: Normal breath sounds. No wheezing or rales.  Abdominal:     General: Bowel sounds are normal. There is no distension.     Palpations: Abdomen is soft.     Tenderness: There is no abdominal tenderness. There is no rebound.  Musculoskeletal:        General: No edema.     Cervical back: Normal range of motion.  Skin:    General: Skin is warm and dry.  Neurological:     Mental Status: She is alert and oriented to person, place, and time.     Coordination: Coordination normal.  Psychiatric:        Mood and Affect: Mood and affect normal.     Vitals:   09/24/20 1537  BP: 132/80  Pulse: 97  Resp: 18  Temp:  98.5 F (36.9 C)  TempSrc: Oral  SpO2: 96%  Weight: 182 lb 3.2 oz (82.6 kg)  Height: 5' 7.25" (1.708 m)    This visit occurred during the SARS-CoV-2 public health emergency.  Safety protocols were in place, including screening questions prior to the visit, additional usage of staff PPE, and extensive cleaning of exam room while observing appropriate contact time as indicated for disinfecting solutions.   Assessment & Plan:

## 2020-09-24 NOTE — Assessment & Plan Note (Signed)
With low potassium last labs about 1 year ago. Recheck CMP. Resume chlorthalidone and amlodipine. Refills sent in today.

## 2020-09-24 NOTE — Assessment & Plan Note (Signed)
Checking HgA1c today.  

## 2020-09-24 NOTE — Patient Instructions (Signed)
We will check the blood work today and have filled the medicines.

## 2020-09-24 NOTE — Assessment & Plan Note (Signed)
Checking CBC and adjust as needed.  

## 2020-09-27 ENCOUNTER — Other Ambulatory Visit: Payer: Self-pay | Admitting: Internal Medicine

## 2020-09-27 DIAGNOSIS — T502X5A Adverse effect of carbonic-anhydrase inhibitors, benzothiadiazides and other diuretics, initial encounter: Secondary | ICD-10-CM

## 2020-09-27 DIAGNOSIS — E876 Hypokalemia: Secondary | ICD-10-CM

## 2020-09-27 MED ORDER — POTASSIUM CHLORIDE CRYS ER 20 MEQ PO TBCR
20.0000 meq | EXTENDED_RELEASE_TABLET | Freq: Two times a day (BID) | ORAL | 0 refills | Status: DC
Start: 2020-09-27 — End: 2022-07-19

## 2020-09-27 MED ORDER — TRIAMTERENE-HCTZ 37.5-25 MG PO TABS: 1.0000 | ORAL_TABLET | Freq: Every day | ORAL | 3 refills | Status: DC

## 2020-09-30 NOTE — Telephone Encounter (Signed)
LVM re: unreturned cologuard kit & instructing pt to call office if questions.

## 2021-12-13 ENCOUNTER — Other Ambulatory Visit: Payer: Self-pay | Admitting: Internal Medicine

## 2022-05-04 ENCOUNTER — Encounter: Payer: Self-pay | Admitting: Internal Medicine

## 2022-05-04 ENCOUNTER — Ambulatory Visit (INDEPENDENT_AMBULATORY_CARE_PROVIDER_SITE_OTHER): Payer: BC Managed Care – PPO | Admitting: Internal Medicine

## 2022-05-04 VITALS — BP 154/100 | HR 93 | Temp 98.2°F | Ht 67.25 in | Wt 188.0 lb

## 2022-05-04 DIAGNOSIS — E876 Hypokalemia: Secondary | ICD-10-CM | POA: Diagnosis not present

## 2022-05-04 DIAGNOSIS — Z1211 Encounter for screening for malignant neoplasm of colon: Secondary | ICD-10-CM | POA: Insufficient documentation

## 2022-05-04 DIAGNOSIS — Z1231 Encounter for screening mammogram for malignant neoplasm of breast: Secondary | ICD-10-CM | POA: Insufficient documentation

## 2022-05-04 DIAGNOSIS — I10 Essential (primary) hypertension: Secondary | ICD-10-CM

## 2022-05-04 DIAGNOSIS — R739 Hyperglycemia, unspecified: Secondary | ICD-10-CM

## 2022-05-04 DIAGNOSIS — Z0000 Encounter for general adult medical examination without abnormal findings: Secondary | ICD-10-CM

## 2022-05-04 DIAGNOSIS — Z124 Encounter for screening for malignant neoplasm of cervix: Secondary | ICD-10-CM | POA: Insufficient documentation

## 2022-05-04 DIAGNOSIS — Z23 Encounter for immunization: Secondary | ICD-10-CM | POA: Diagnosis not present

## 2022-05-04 DIAGNOSIS — N182 Chronic kidney disease, stage 2 (mild): Secondary | ICD-10-CM

## 2022-05-04 DIAGNOSIS — T502X5A Adverse effect of carbonic-anhydrase inhibitors, benzothiadiazides and other diuretics, initial encounter: Secondary | ICD-10-CM

## 2022-05-04 DIAGNOSIS — E269 Hyperaldosteronism, unspecified: Secondary | ICD-10-CM

## 2022-05-04 LAB — CBC WITH DIFFERENTIAL/PLATELET
Basophils Absolute: 0 10*3/uL (ref 0.0–0.1)
Basophils Relative: 0.7 % (ref 0.0–3.0)
Eosinophils Absolute: 0.1 10*3/uL (ref 0.0–0.7)
Eosinophils Relative: 2.8 % (ref 0.0–5.0)
HCT: 37.7 % (ref 36.0–46.0)
Hemoglobin: 13 g/dL (ref 12.0–15.0)
Lymphocytes Relative: 32.4 % (ref 12.0–46.0)
Lymphs Abs: 1.5 10*3/uL (ref 0.7–4.0)
MCHC: 34.4 g/dL (ref 30.0–36.0)
MCV: 94 fl (ref 78.0–100.0)
Monocytes Absolute: 0.5 10*3/uL (ref 0.1–1.0)
Monocytes Relative: 10.2 % (ref 3.0–12.0)
Neutro Abs: 2.4 10*3/uL (ref 1.4–7.7)
Neutrophils Relative %: 53.9 % (ref 43.0–77.0)
Platelets: 352 10*3/uL (ref 150.0–400.0)
RBC: 4.01 Mil/uL (ref 3.87–5.11)
RDW: 12.8 % (ref 11.5–15.5)
WBC: 4.5 10*3/uL (ref 4.0–10.5)

## 2022-05-04 LAB — BASIC METABOLIC PANEL
BUN: 14 mg/dL (ref 6–23)
CO2: 31 mEq/L (ref 19–32)
Calcium: 10 mg/dL (ref 8.4–10.5)
Chloride: 98 mEq/L (ref 96–112)
Creatinine, Ser: 1.18 mg/dL (ref 0.40–1.20)
GFR: 53.33 mL/min — ABNORMAL LOW (ref >60.00)
Glucose, Bld: 90 mg/dL (ref 70–99)
Potassium: 3.6 mEq/L (ref 3.5–5.1)
Sodium: 136 mEq/L (ref 135–145)

## 2022-05-04 LAB — LIPID PANEL
Cholesterol: 212 mg/dL — ABNORMAL HIGH (ref 0–200)
HDL: 42.8 mg/dL (ref >39.00)
LDL Cholesterol: 135 mg/dL — ABNORMAL HIGH (ref 0–99)
NonHDL: 169.37
Total CHOL/HDL Ratio: 5
Triglycerides: 174 mg/dL — ABNORMAL HIGH (ref 0.0–149.0)
VLDL: 34.8 mg/dL (ref 0.0–40.0)

## 2022-05-04 LAB — HEPATIC FUNCTION PANEL
ALT: 11 U/L (ref 0–35)
AST: 14 U/L (ref 0–37)
Albumin: 4.3 g/dL (ref 3.5–5.2)
Alkaline Phosphatase: 74 U/L (ref 39–117)
Bilirubin, Direct: 0.2 mg/dL (ref 0.0–0.3)
Total Bilirubin: 1.1 mg/dL (ref 0.2–1.2)
Total Protein: 8.5 g/dL — ABNORMAL HIGH (ref 6.0–8.3)

## 2022-05-04 LAB — TSH: TSH: 1.54 u[IU]/mL (ref 0.35–5.50)

## 2022-05-04 LAB — MAGNESIUM: Magnesium: 1.6 mg/dL (ref 1.5–2.5)

## 2022-05-04 NOTE — Patient Instructions (Addendum)
PLEASE COME BACK IN 3 MONTHS FOR A BLOOD PRESSURE RECHECK AND YOUR SHINGLES BOOSTER   Health Maintenance, Female Adopting a healthy lifestyle and getting preventive care are important in promoting health and wellness. Ask your health care provider about: The right schedule for you to have regular tests and exams. Things you can do on your own to prevent diseases and keep yourself healthy. What should I know about diet, weight, and exercise? Eat a healthy diet  Eat a diet that includes plenty of vegetables, fruits, low-fat dairy products, and lean protein. Do not eat a lot of foods that are high in solid fats, added sugars, or sodium. Maintain a healthy weight Body mass index (BMI) is used to identify weight problems. It estimates body fat based on height and weight. Your health care provider can help determine your BMI and help you achieve or maintain a healthy weight. Get regular exercise Get regular exercise. This is one of the most important things you can do for your health. Most adults should: Exercise for at least 150 minutes each week. The exercise should increase your heart rate and make you sweat (moderate-intensity exercise). Do strengthening exercises at least twice a week. This is in addition to the moderate-intensity exercise. Spend less time sitting. Even light physical activity can be beneficial. Watch cholesterol and blood lipids Have your blood tested for lipids and cholesterol at 52 years of age, then have this test every 5 years. Have your cholesterol levels checked more often if: Your lipid or cholesterol levels are high. You are older than 52 years of age. You are at high risk for heart disease. What should I know about cancer screening? Depending on your health history and family history, you may need to have cancer screening at various ages. This may include screening for: Breast cancer. Cervical cancer. Colorectal cancer. Skin cancer. Lung cancer. What should I  know about heart disease, diabetes, and high blood pressure? Blood pressure and heart disease High blood pressure causes heart disease and increases the risk of stroke. This is more likely to develop in people who have high blood pressure readings or are overweight. Have your blood pressure checked: Every 3-5 years if you are 77-38 years of age. Every year if you are 67 years old or older. Diabetes Have regular diabetes screenings. This checks your fasting blood sugar level. Have the screening done: Once every three years after age 30 if you are at a normal weight and have a low risk for diabetes. More often and at a younger age if you are overweight or have a high risk for diabetes. What should I know about preventing infection? Hepatitis B If you have a higher risk for hepatitis B, you should be screened for this virus. Talk with your health care provider to find out if you are at risk for hepatitis B infection. Hepatitis C Testing is recommended for: Everyone born from [birth year] through 1965. Anyone with known risk factors for hepatitis C. Sexually transmitted infections (STIs) Get screened for STIs, including gonorrhea and chlamydia, if: You are sexually active and are younger than 52 years of age. You are older than 52 years of age and your health care provider tells you that you are at risk for this type of infection. Your sexual activity has changed since you were last screened, and you are at increased risk for chlamydia or gonorrhea. Ask your health care provider if you are at risk. Ask your health care provider about whether you are at  high risk for HIV. Your health care provider may recommend a prescription medicine to help prevent HIV infection. If you choose to take medicine to prevent HIV, you should first get tested for HIV. You should then be tested every 3 months for as long as you are taking the medicine. Pregnancy If you are about to stop having your period (premenopausal) and  you may become pregnant, seek counseling before you get pregnant. Take 400 to 800 micrograms (mcg) of folic acid every day if you become pregnant. Ask for birth control (contraception) if you want to prevent pregnancy. Osteoporosis and menopause Osteoporosis is a disease in which the bones lose minerals and strength with aging. This can result in bone fractures. If you are 42 years old or older, or if you are at risk for osteoporosis and fractures, ask your health care provider if you should: Be screened for bone loss. Take a calcium or vitamin D supplement to lower your risk of fractures. Be given hormone replacement therapy (HRT) to treat symptoms of menopause. Follow these instructions at home: Alcohol use Do not drink alcohol if: Your health care provider tells you not to drink. You are pregnant, may be pregnant, or are planning to become pregnant. If you drink alcohol: Limit how much you have to: 0-1 drink a day. Know how much alcohol is in your drink. In the [place] one drink equals one 12 oz bottle of beer (355 mL), one 5 oz glass of wine (148 mL), or one 1 oz glass of hard liquor (44 mL). Lifestyle Do not use any products that contain nicotine or tobacco. These products include cigarettes, chewing tobacco, and vaping devices, such as e-cigarettes. If you need help quitting, ask your health care provider. Do not use street drugs. Do not share needles. Ask your health care provider for help if you need support or information about quitting drugs. General instructions Schedule regular health, dental, and eye exams. Stay current with your vaccines. Tell your health care provider if: You often feel depressed. You have ever been abused or do not feel safe at home. Summary Adopting a healthy lifestyle and getting preventive care are important in promoting health and wellness. Follow your health care provider's instructions about healthy diet, exercising, and getting tested or screened  for diseases. Follow your health care provider's instructions on monitoring your cholesterol and blood pressure. This information is not intended to replace advice given to you by your health care provider. Make sure you discuss any questions you have with your health care provider. Document Revised: 12/27/2020 Document Reviewed: 12/27/2020 Elsevier Patient Education  [place].

## 2022-05-04 NOTE — Progress Notes (Unsigned)
Subjective:  Patient ID: Tabitha Matthews, female    DOB: February 20, 1971  Age: 51 y.o. MRN: [id number]  CC: Annual Exam   HPI Tabitha Matthews presents for a CPX and f/up -   She is not taking amlodipine but she is taking the diuretic.  She is active and denies chest pain, shortness of breath, diaphoresis, edema, dizziness, lightheadedness, or dyspnea on exertion.  Outpatient Medications Prior to Visit  Medication Sig Dispense Refill   Cholecalciferol (VITAMIN D3) 1.25 MG (50000 UT) CAPS Take 1 capsule by mouth once a week. 12 capsule 0   escitalopram (LEXAPRO) 10 MG tablet Take 10 mg by mouth daily.     ferrous sulfate 325 (65 FE) MG tablet Take 1 tablet (325 mg total) by mouth daily with breakfast. 90 tablet 1   amLODipine (NORVASC) 10 MG tablet Take 1 tablet (10 mg total) by mouth daily. 90 tablet 3   triamterene-hydrochlorothiazide (MAXZIDE-25) 37.5-25 MG tablet Take 1 tablet by mouth daily. 90 tablet 3   potassium chloride SA (KLOR-CON) 20 MEQ tablet Take 1 tablet (20 mEq total) by mouth 2 (two) times daily for 5 days. 10 tablet 0   No facility-administered medications prior to visit.    ROS Review of Systems  Constitutional:  Negative for diaphoresis, fatigue and unexpected weight change.  HENT: Negative.    Eyes: Negative.   Respiratory:  Negative for cough, chest tightness, shortness of breath and wheezing.   Cardiovascular:  Negative for chest pain, palpitations and leg swelling.  Gastrointestinal:  Negative for abdominal pain, constipation, diarrhea, nausea and vomiting.  Genitourinary:  Positive for frequency. Negative for difficulty urinating, dysuria, flank pain, hematuria and urgency.  Musculoskeletal: Negative.   Skin: Negative.   Neurological:  Negative for dizziness, weakness and headaches.  Hematological:  Negative for adenopathy. Does not bruise/bleed easily.  Psychiatric/Behavioral: Negative.      Objective:  BP (!) 154/100 (BP Location: Right Arm, Patient  Position: Sitting, Cuff Size: Large)   Pulse 93   Temp 98.2 F (36.8 C) (Oral)   Ht 5' 7.25" (1.708 m)   Wt 188 lb (85.3 kg)   LMP 05/15/2018 (Exact Date)   SpO2 96%   BMI 29.23 kg/m   BP Readings from Last 3 Encounters:  05/04/22 (!) 154/100  09/24/20 132/80  11/03/19 136/86    Wt Readings from Last 3 Encounters:  05/04/22 188 lb (85.3 kg)  09/24/20 182 lb 3.2 oz (82.6 kg)  11/03/19 178 lb (80.7 kg)    Physical Exam Vitals reviewed.  HENT:     Nose: Nose normal.     Mouth/Throat:     Mouth: Mucous membranes are moist.  Eyes:     General: No scleral icterus.    Conjunctiva/sclera: Conjunctivae normal.  Cardiovascular:     Rate and Rhythm: Normal rate and regular rhythm.     Heart sounds: No murmur heard. Pulmonary:     Breath sounds: No stridor. No wheezing, rhonchi or rales.  Abdominal:     General: Abdomen is flat.     Palpations: There is no mass.     Tenderness: There is no guarding or rebound.     Hernia: No hernia is present.  Musculoskeletal:        General: Normal range of motion.     Cervical back: Neck supple.     Right lower leg: No edema.     Left lower leg: No edema.  Lymphadenopathy:     Cervical: No cervical adenopathy.  Skin:    General: Skin is warm and dry.  Neurological:     General: No focal deficit present.     Mental Status: She is alert.  Psychiatric:        Mood and Affect: Mood normal.        Behavior: Behavior normal.     Lab Results  Component Value Date   WBC 4.5 05/04/2022   HGB 13.0 05/04/2022   HCT 37.7 05/04/2022   PLT 352.0 05/04/2022   GLUCOSE 90 05/04/2022   CHOL 212 (H) 05/04/2022   TRIG 174.0 (H) 05/04/2022   HDL 42.80 05/04/2022   LDLCALC 135 (H) 05/04/2022   ALT 11 05/04/2022   AST 14 05/04/2022   NA 136 05/04/2022   K 3.6 05/04/2022   CL 98 05/04/2022   CREATININE 1.18 05/04/2022   BUN 14 05/04/2022   CO2 31 05/04/2022   TSH 1.54 05/04/2022   HGBA1C 5.6 05/04/2022    MM DIGITAL SCREENING  BILATERAL  Result Date: 12/05/2019 CLINICAL DATA:  Screening. EXAM: DIGITAL SCREENING BILATERAL MAMMOGRAM WITH CAD COMPARISON:  Previous exam(s). ACR Breast Density Category d: The breast tissue is extremely dense, which lowers the sensitivity of mammography FINDINGS: There are no findings suspicious for malignancy. Images were processed with CAD. IMPRESSION: No mammographic evidence of malignancy. A result letter of this screening mammogram will be mailed directly to the patient. RECOMMENDATION: Screening mammogram in one year. (Code:SM-B-01Y) BI-RADS CATEGORY  1: Negative. Electronically Signed   By: Curlene Dolphin M.D.   On: 12/05/2019 09:19    Assessment & Plan:   Tabitha Matthews was seen today for annual exam.  Diagnoses and all orders for this visit:  Essential hypertension- Her blood pressure is not adequately well controlled.  Will restart amlodipine. -     Basic metabolic panel; Future -     CBC with Differential/Platelet; Future -     TSH; Future -     Urinalysis, Routine w reflex microscopic; Future -     Hepatic function panel; Future -     Aldosterone + renin activity w/ ratio; Future -     Aldosterone + renin activity w/ ratio -     Hepatic function panel -     Urinalysis, Routine w reflex microscopic -     TSH -     CBC with Differential/Platelet -     Basic metabolic panel -     triamterene-hydrochlorothiazide (MAXZIDE-25) 37.5-25 MG tablet; Take 1 tablet by mouth daily. -     amLODipine (NORVASC) 10 MG tablet; Take 1 tablet (10 mg total) by mouth daily.  Routine general medical examination at a health care facility- Exam completed, labs reviewed-statin therapy is not indicated, she deferred on the flu vaccine, cancer screenings addressed, patient education was given. -     Lipid panel; Future -     Lipid panel  Hyperglycemia -     Hemoglobin A1c; Future -     Hemoglobin A1c  Diuretic-induced hypokalemia- Potassium is normal now. -     Basic metabolic panel; Future -      Magnesium; Future -     Magnesium -     Basic metabolic panel  Screen for colon cancer -     Cologuard  Visit for screening mammogram -     MM DIGITAL SCREENING BILATERAL; Future  Cervical cancer screening -     Ambulatory referral to Gynecology  Chronic renal disease, stage 2, mildly decreased glomerular filtration rate (GFR) between 60-89  mL/min/1.73 square meter- Will try to get better control of her blood pressure.  Other orders -     Zoster Recombinant (Shingrix )   I am having Tabitha Matthews maintain her ferrous sulfate, escitalopram, Vitamin D3, potassium chloride SA, triamterene-hydrochlorothiazide, and amLODipine.  Meds ordered this encounter  Medications   triamterene-hydrochlorothiazide (MAXZIDE-25) 37.5-25 MG tablet    Sig: Take 1 tablet by mouth daily.    Dispense:  90 tablet    Refill:  0   amLODipine (NORVASC) 10 MG tablet    Sig: Take 1 tablet (10 mg total) by mouth daily.    Dispense:  90 tablet    Refill:  0     Follow-up: Return in about 3 months (around 08/03/2022).  Tabitha Calico, MD

## 2022-05-05 DIAGNOSIS — N182 Chronic kidney disease, stage 2 (mild): Secondary | ICD-10-CM | POA: Insufficient documentation

## 2022-05-05 LAB — URINALYSIS, ROUTINE W REFLEX MICROSCOPIC
Bilirubin Urine: NEGATIVE
Hgb urine dipstick: NEGATIVE
Ketones, ur: NEGATIVE
Leukocytes,Ua: NEGATIVE
Nitrite: NEGATIVE
RBC / HPF: NONE SEEN (ref 0–?)
Specific Gravity, Urine: <=1.005 — AB (ref 1.000–1.030)
Total Protein, Urine: NEGATIVE
Urine Glucose: NEGATIVE
Urobilinogen, UA: 0.2 (ref 0.0–1.0)
pH: 6 (ref 5.0–8.0)

## 2022-05-05 LAB — HEMOGLOBIN A1C: Hgb A1c MFr Bld: 5.6 % (ref 4.6–6.5)

## 2022-05-05 MED ORDER — TRIAMTERENE-HCTZ 37.5-25 MG PO TABS: 1.0000 | ORAL_TABLET | Freq: Every day | ORAL | 0 refills | Status: DC

## 2022-05-05 MED ORDER — AMLODIPINE BESYLATE 10 MG PO TABS: 10.0000 mg | ORAL_TABLET | Freq: Every day | ORAL | 0 refills | Status: DC

## 2022-05-13 LAB — ALDOSTERONE + RENIN ACTIVITY W/ RATIO
ALDO / PRA Ratio: 75 Ratio — ABNORMAL HIGH (ref 0.9–28.9)
Aldosterone: 18 ng/dL
Renin Activity: 0.24 ng/mL/h — ABNORMAL LOW (ref 0.25–5.82)

## 2022-05-21 ENCOUNTER — Other Ambulatory Visit: Payer: Self-pay | Admitting: Internal Medicine

## 2022-05-21 DIAGNOSIS — E269 Hyperaldosteronism, unspecified: Secondary | ICD-10-CM | POA: Insufficient documentation

## 2022-07-05 ENCOUNTER — Ambulatory Visit
Admission: RE | Admit: 2022-07-05 | Discharge: 2022-07-05 | Disposition: A | Discharge status: Home / Self Care | Payer: BC Managed Care – PPO | Source: Ambulatory Visit | Attending: Internal Medicine | Admitting: Internal Medicine

## 2022-07-05 DIAGNOSIS — Z1231 Encounter for screening mammogram for malignant neoplasm of breast: Secondary | ICD-10-CM

## 2022-07-19 ENCOUNTER — Encounter: Payer: Self-pay | Admitting: "Endocrinology

## 2022-07-19 ENCOUNTER — Ambulatory Visit: Payer: BC Managed Care – PPO | Admitting: "Endocrinology

## 2022-07-19 VITALS — BP 122/80 | HR 72 | Ht 67.25 in | Wt 180.0 lb

## 2022-07-19 DIAGNOSIS — E782 Mixed hyperlipidemia: Secondary | ICD-10-CM | POA: Insufficient documentation

## 2022-07-19 DIAGNOSIS — E349 Endocrine disorder, unspecified: Secondary | ICD-10-CM | POA: Insufficient documentation

## 2022-07-19 DIAGNOSIS — I10 Essential (primary) hypertension: Secondary | ICD-10-CM | POA: Diagnosis not present

## 2022-07-19 NOTE — Patient Instructions (Signed)

## 2022-07-19 NOTE — Progress Notes (Signed)
Endocrinology Consult Note                                            07/19/2022, 12:29 PM   Subjective:    Patient ID: Tabitha Matthews, female    DOB: Aug 26, 1970, PCP Janith Lima, MD   Past Medical History:  Diagnosis Date   Anemia    Hyperlipidemia    Hypertension 05/17/2018   IDA (iron deficiency anemia) 05/17/2018   Past Surgical History:  Procedure Laterality Date   CESAREAN SECTION     ROBOTIC ASSISTED LAPAROSCOPIC HYSTERECTOMY AND SALPINGECTOMY Bilateral 05/23/2018   Procedure: XI ROBOTIC ASSISTED LAPAROSCOPIC HYSTERECTOMY AND SALPINGECTOMY;  Surgeon: Servando Salina, MD;  Location: [facility];  Service: Gynecology;  Laterality: Bilateral;   Social History   Socioeconomic History   Marital status: Married    Spouse name: Not on file   Number of children: Not on file   Years of education: Not on file   Highest education level: Not on file  Occupational History   Not on file  Tobacco Use   Smoking status: Never    Passive exposure: Never   Smokeless tobacco: Never  Vaping Use   Vaping Use: Never used  Substance and Sexual Activity   Alcohol use: No   Drug use: No   Sexual activity: Yes    Partners: Male    Birth control/protection: I.U.D.  Other Topics Concern   Not on file  Social History Narrative   Not on file   Social Determinants of Health   Financial Resource Strain: Not on file  Food Insecurity: Not on file  Transportation Needs: Not on file  Physical Activity: Not on file  Stress: Not on file  Social Connections: Not on file   Family History  Problem Relation Age of Onset   Cancer Mother    Kidney disease Mother    Hyperlipidemia Mother    Hypertension Mother    Heart failure Mother    Hyperlipidemia Father    Hypertension Father    Depression Brother    Alcohol abuse Neg Hx    COPD Neg Hx    Diabetes Neg Hx    Early death Neg Hx    Heart disease Neg Hx    Stroke Neg Hx    Breast cancer Neg Hx    Outpatient  Encounter Medications as of 07/19/2022  Medication Sig   amLODipine (NORVASC) 10 MG tablet Take 1 tablet (10 mg total) by mouth daily.   escitalopram (LEXAPRO) 10 MG tablet Take 20 mg by mouth daily.   triamterene-hydrochlorothiazide (MAXZIDE-25) 37.5-25 MG tablet Take 1 tablet by mouth daily.   [DISCONTINUED] Cholecalciferol (VITAMIN D3) 1.25 MG (50000 UT) CAPS Take 1 capsule by mouth once a week.   [DISCONTINUED] ferrous sulfate 325 (65 FE) MG tablet Take 1 tablet (325 mg total) by mouth daily with breakfast.   [DISCONTINUED] potassium chloride SA (KLOR-CON) 20 MEQ tablet Take 1 tablet (20 mEq total) by mouth 2 (two) times daily for 5 days.   No facility-administered encounter medications on file as of 07/19/2022.   ALLERGIES: No Known Allergies  VACCINATION STATUS: Immunization History  Administered Date(s) Administered   Moderna Sars-Covid-2 Vaccination 10/17/2019, 11/14/2019   PPD Test 12/20/2016   Tdap 01/26/2014   Zoster Recombinat (Shingrix) 05/04/2022    HPI Tabitha Matthews is 51  y.o. female who presents today with a medical history as above. she is being seen in consultation for elevated PEC / PRA ratio requested by Janith Lima, MD.   On a routine blood work in September she was found to have 5 - with addition of 18, plasma renin activity of 0.2, which lead to elevated ratio 75. She has high blood pressure on medications including amlodipine 10 mg, and triamterene/HCTZ 37.5/25 mg p.o. once a day.  She denies any prior history of uncontrolled blood pressure.  She was diagnosed in her mid 27s.  She is not known to have adrenal adenoma.  She did have mild hypokalemia which required intermittent supplement with potassium.  She does have family history of hypertension, hyperlipidemia and heart failure. She denies coronary disease, CVA.  She does have uncontrolled hyperlipidemia not on treatment. She does not have recent abdominal imaging specifically no adrenal imaging studies.   She does not have acute complaints today. She is a non-smoker.   Review of Systems  Constitutional: + Minimally fluctuating body weight,  no fatigue, no subjective hyperthermia, no subjective hypothermia Eyes: no blurry vision, no xerophthalmia ENT: no sore throat, no nodules palpated in throat, no dysphagia/odynophagia, no hoarseness Cardiovascular: no Chest Pain, no Shortness of Breath, no palpitations, no leg swelling Respiratory: no cough, no shortness of breath Gastrointestinal: no Nausea/Vomiting/Diarhhea Musculoskeletal: no muscle/joint aches Skin: no rashes Neurological: no tremors, no numbness, no tingling, no dizziness Psychiatric: no depression, no anxiety  Objective:       07/19/2022    8:40 AM 05/04/2022    3:14 PM 05/04/2022    3:09 PM  Vitals with BMI  Height 5' 7.25"  5' 7.25"  Weight 180 lbs  188 lbs  BMI 73.22  02.54  Systolic 270 623 762  Diastolic 80 831 98  Pulse 72  93    BP 122/80   Pulse 72   Ht 5' 7.25" (1.708 m)   Wt 180 lb (81.6 kg)   LMP 05/15/2018 (Exact Date)   BMI 27.98 kg/m   Wt Readings from Last 3 Encounters:  07/19/22 180 lb (81.6 kg)  05/04/22 188 lb (85.3 kg)  09/24/20 182 lb 3.2 oz (82.6 kg)    Physical Exam  Constitutional:  Body mass index is 27.98 kg/m.,  not in acute distress, normal state of mind Eyes: PERRLA, EOMI, no exophthalmos ENT: moist mucous membranes, no gross thyromegaly, no gross cervical lymphadenopathy Cardiovascular: normal precordial activity, Regular Rate and Rhythm, no Murmur/Rubs/Gallops Respiratory:  adequate breathing efforts, no gross chest deformity, Clear to auscultation bilaterally Gastrointestinal: abdomen soft, Non -tender, No distension, Bowel Sounds present, no gross organomegaly Musculoskeletal: no gross deformities, strength intact in all four extremities Skin: moist, warm, no rashes Neurological: no tremor with outstretched hands, Deep tendon reflexes normal in bilateral lower  extremities.  CMP ( most recent) CMP     Component Value Date/Time   NA 136 05/04/2022 1543   K 3.6 05/04/2022 1543   CL 98 05/04/2022 1543   CO2 31 05/04/2022 1543   GLUCOSE 90 05/04/2022 1543   BUN 14 05/04/2022 1543   CREATININE 1.18 05/04/2022 1543   CALCIUM 10.0 05/04/2022 1543   PROT 8.5 (H) 05/04/2022 1543   ALBUMIN 4.3 05/04/2022 1543   AST 14 05/04/2022 1543   ALT 11 05/04/2022 1543   ALKPHOS 74 05/04/2022 1543   BILITOT 1.1 05/04/2022 1543   GFRNONAA >60 05/24/2018 0522   GFRAA >60 05/24/2018 0522     Diabetic Labs (  most recent): Lab Results  Component Value Date   HGBA1C 5.6 05/04/2022   HGBA1C 5.5 09/24/2020   HGBA1C 5.4 11/03/2019     Lipid Panel ( most recent) Lipid Panel     Component Value Date/Time   CHOL 212 (H) 05/04/2022 1543   TRIG 174.0 (H) 05/04/2022 1543   HDL 42.80 05/04/2022 1543   CHOLHDL 5 05/04/2022 1543   VLDL 34.8 05/04/2022 1543   LDLCALC 135 (H) 05/04/2022 1543      Lab Results  Component Value Date   TSH 1.54 05/04/2022   TSH 2.39 11/03/2019   TSH 2.24 06/17/2018   TSH 1.41 12/20/2016   TSH 1.03 11/18/2015   TSH 1.54 06/10/2015   TSH 1.20 01/26/2014   TSH 1.48 09/03/2012   TSH 1.86 03/26/2007             Component Ref Range & Units 2 mo ago  Aldosterone  ng/dL 18  Comment: . Unable to flag abnormal result(s), please refer     to reference range(s) below: . Adult Reference Ranges for Aldosterone, LC/MS/MS:     Upright  8:00 - 10:00 am    < or = 28 ng/dL     Upright  4:00 -  6:00 pm    < or = 21 ng/dL     Supine   8:00 - 10:00 am       3 - 16 ng/dL .  Renin Activity 0.25 - 5.82 ng/mL/h 0.24 Low   ALDO / PRA Ratio 0.9 - 28.9 Ratio 75.0 High       Assessment & Plan:   1. Endocrine disorder, unspecified 2. Essential hypertension, benign 3. Mixed hyperlipidemia  - Tabitha Matthews  is being seen at a kind request of Janith Lima, MD. - I have reviewed her available endocrine  records and clinically  evaluated the patient. - Based on these reviews, she has abnormally elevated ratio of ALDO/PRA,  however,  there is not sufficient information to proceed with definitive treatment plan. Her aldosterone level is not significantly elevated and her history is not suggestive of primary hyperaldosteronism at this time.  Pretest probability for primary aldosteronism is low.  However this patient would benefit with further studies including repeat aldosterone, plasma renin activity, and ratio in the morning, as well as 24-hour urine collection for aldosterone, sodium and creatinine.  If her test results indicate further evidence of case detection for hyperaldosteronism, she will be considered for confirmatory studies followed by imaging studies.   Her blood pressure is controlled at 122/80, advised to continue her medications including amlodipine 10 mg p.o. daily and triamterene/hydrochlorothiazide 37.5/25 mg p.o. once a day.  In light of evidence of metabolic dysfunction including severe hyperlipidemia, hypertension, and the fact that she wants to avoid medications to treat hyperlipidemia, this patient is a good candidate for lifestyle medicine.  - she acknowledges that there is a room for improvement in her food and drink choices. - Suggestion is made for her to avoid simple carbohydrates  from her diet including Cakes, Sweet Desserts, Ice Cream, Soda (diet and regular), Sweet Tea, Candies, Chips, Cookies, Store Bought Juices, Alcohol , Artificial Sweeteners,  Coffee Creamer, and "Sugar-free" Products, Lemonade. This will help patient to have more stable blood glucose profile and potentially avoid unintended weight gain.  The following Lifestyle Medicine recommendations according to [facility]) were discussed and and offered to patient and she  agrees to start the journey:  A.  Whole Foods, Plant-Based Nutrition comprising of fruits and vegetables, plant-based proteins,  whole-grain carbohydrates was discussed in detail with the patient.   A list for source of those nutrients were also provided to the patient.  Patient will use only water or unsweetened tea for hydration. B.  The need to stay away from risky substances including alcohol, smoking; obtaining 7 to 9 hours of restorative sleep, at least 150 minutes of moderate intensity exercise weekly, the importance of healthy social connections,  and stress management techniques were discussed. C.  A full color page of  Calorie density of various food groups per pound showing examples of each food groups was provided to the patient.  Her most recent CMP showed potassium 3.6, she is not on potassium supplements. - I did not initiate any new prescriptions today. - she is advised to maintain close follow up with Janith Lima, MD for primary care needs.   - Time spent with the patient: 50 minutes, of which >50% was spent in  counseling her about her abnormal aldosterone/renin ratio, hypertension, hyperlipidemia and the rest in obtaining information about her symptoms, reviewing her previous labs/studies ( including abstractions from other facilities),  evaluations, and treatments,  and developing a plan to confirm diagnosis and long term treatment based on the latest standards of care/guidelines; and documenting her care.  Tabitha Matthews participated in the discussions, expressed understanding, and voiced agreement with the above plans.  All questions were answered to her satisfaction. she is encouraged to contact clinic should she have any questions or concerns prior to her return visit.  Follow up plan: Return in about 4 weeks (around 08/16/2022), or 24 hour urine studies, for F/U with Pre-visit Labs, Fasting Labs  in AM B4 8.   Glade Lloyd, MD [facility] [address] [place], [place] [address] Phone: [phone]  Fax: [phone]     07/19/2022, 12:29  PM  This note was partially dictated with voice recognition software. Similar sounding words can be transcribed inadequately or may not  be corrected upon review.

## 2022-08-01 LAB — SODIUM, URINE, 24 HOUR
Sodium, 24H Ur: 164 mmol/24 hr (ref 39–258)
Sodium, Ur: 126 mmol/L

## 2022-08-01 LAB — CREATININE, URINE, 24 HOUR
Creatinine, 24H Ur: 1479 mg/24 hr (ref 800–1800)
Creatinine, Urine: 113.8 mg/dL

## 2022-08-02 ENCOUNTER — Other Ambulatory Visit: Payer: Self-pay | Admitting: Internal Medicine

## 2022-08-02 DIAGNOSIS — I10 Essential (primary) hypertension: Secondary | ICD-10-CM

## 2022-08-17 ENCOUNTER — Ambulatory Visit: Payer: BC Managed Care – PPO | Admitting: "Endocrinology

## 2022-08-18 LAB — LIPID PANEL
Cholesterol: 241 — AB (ref 0–200)
HDL: 43 (ref 35–70)
LDL Cholesterol: 174
Triglycerides: 132 (ref 40–160)

## 2022-08-23 LAB — ALDOSTERONE, URINE

## 2022-08-27 LAB — ALDOSTERONE + RENIN ACTIVITY W/ RATIO
Aldos/Renin Ratio: 24.2 (ref 0.0–30.0)
Aldosterone: 29.3 ng/dL (ref 0.0–30.0)
Renin Activity, Plasma: 1.211 ng/mL/hr (ref 0.167–5.380)

## 2022-08-27 LAB — LIPID PANEL
Chol/HDL Ratio: 5.6 ratio — ABNORMAL HIGH (ref 0.0–4.4)
Cholesterol, Total: 241 mg/dL — ABNORMAL HIGH (ref 100–199)
HDL: 43 mg/dL (ref >39)
LDL Chol Calc (NIH): 174 mg/dL — ABNORMAL HIGH (ref 0–99)
Triglycerides: 132 mg/dL (ref 0–149)
VLDL Cholesterol Cal: 24 mg/dL (ref 5–40)

## 2022-09-18 ENCOUNTER — Encounter: Payer: Self-pay | Admitting: "Endocrinology

## 2022-09-18 ENCOUNTER — Ambulatory Visit: Payer: BC Managed Care – PPO | Admitting: "Endocrinology

## 2022-09-18 VITALS — BP 114/76 | HR 76 | Ht 67.25 in | Wt 183.0 lb

## 2022-09-18 DIAGNOSIS — E782 Mixed hyperlipidemia: Secondary | ICD-10-CM

## 2022-09-18 DIAGNOSIS — I10 Essential (primary) hypertension: Secondary | ICD-10-CM

## 2022-09-18 DIAGNOSIS — E349 Endocrine disorder, unspecified: Secondary | ICD-10-CM

## 2022-09-18 MED ORDER — ROSUVASTATIN CALCIUM 5 MG PO TABS: 5.0000 mg | ORAL_TABLET | Freq: Every day | ORAL | 1 refills | Status: DC

## 2022-09-18 NOTE — Progress Notes (Signed)
09/18/2022, 12:54 PM  Endocrinology follow-up note   Subjective:    Patient ID: Tabitha Matthews, female    DOB: [birth year], PCP Janith Lima, MD   Past Medical History:  Diagnosis Date   Anemia    Hyperlipidemia    Hypertension 05/17/2018   IDA (iron deficiency anemia) 05/17/2018   Past Surgical History:  Procedure Laterality Date   CESAREAN SECTION     ROBOTIC ASSISTED LAPAROSCOPIC HYSTERECTOMY AND SALPINGECTOMY Bilateral 05/23/2018   Procedure: XI ROBOTIC ASSISTED LAPAROSCOPIC HYSTERECTOMY AND SALPINGECTOMY;  Surgeon: Servando Salina, MD;  Location: [facility];  Service: Gynecology;  Laterality: Bilateral;   Social History   Socioeconomic History   Marital status: Married    Spouse name: Not on file   Number of children: Not on file   Years of education: Not on file   Highest education level: Not on file  Occupational History   Not on file  Tobacco Use   Smoking status: Never    Passive exposure: Never   Smokeless tobacco: Never  Vaping Use   Vaping Use: Never used  Substance and Sexual Activity   Alcohol use: No   Drug use: No   Sexual activity: Yes    Partners: Male    Birth control/protection: I.U.D.  Other Topics Concern   Not on file  Social History Narrative   Not on file   Social Determinants of Health   Financial Resource Strain: Not on file  Food Insecurity: Not on file  Transportation Needs: Not on file  Physical Activity: Not on file  Stress: Not on file  Social Connections: Not on file   Family History  Problem Relation Age of Onset   Cancer Mother    Kidney disease Mother    Hyperlipidemia Mother    Hypertension Mother    Heart failure Mother    Hyperlipidemia Father    Hypertension Father    Depression Brother    Alcohol abuse Neg Hx    COPD Neg Hx    Diabetes Neg Hx    Early death Neg Hx    Heart disease Neg Hx    Stroke Neg Hx    Breast cancer Neg Hx    Outpatient  Encounter Medications as of 09/18/2022  Medication Sig   rosuvastatin (CRESTOR) 5 MG tablet Take 1 tablet (5 mg total) by mouth at bedtime.   amLODipine (NORVASC) 10 MG tablet TAKE 1 TABLET BY MOUTH EVERY DAY   escitalopram (LEXAPRO) 10 MG tablet Take 20 mg by mouth daily.   triamterene-hydrochlorothiazide (MAXZIDE-25) 37.5-25 MG tablet TAKE 1 TABLET BY MOUTH EVERY DAY   No facility-administered encounter medications on file as of 09/18/2022.   ALLERGIES: No Known Allergies  VACCINATION STATUS: Immunization History  Administered Date(s) Administered   Moderna Sars-Covid-2 Vaccination 10/17/2019, 11/14/2019   PPD Test 12/20/2016   Tdap 01/26/2014   Zoster Recombinat (Shingrix) 05/04/2022    HPI Tabitha Matthews WEIGHT is 52 y.o. female who presents today with a medical history as above. she is being seen in follow-up after she was seen in consultation for elevated PEC / PRA ratio requested by Janith Lima, MD.   On a routine blood work in September she was found  to have 5 - with addition of 18, plasma renin activity of 0.2, which lead to elevated ratio 75. Her repeat labs show complete normalization of these numbers.  However her labs show significantly worsening lipid disorder with LDL of 174 worsening from 135. She has high blood pressure on medications including amlodipine 10 mg, and triamterene/HCTZ 37.5/25 mg p.o. once a day.  She denies any prior history of uncontrolled blood pressure.  She was diagnosed in her mid 19s.  She is not known to have adrenal adenoma.  She did have mild hypokalemia which required intermittent supplement with potassium.  She does have family history of hypertension, hyperlipidemia and heart failure. She denies coronary disease, CVA.  She does have uncontrolled hyperlipidemia not on treatment. She does not have recent abdominal imaging specifically no adrenal imaging studies.  She does not have acute complaints today. She is a non-smoker.   Review of  Systems  Constitutional: + Minimally fluctuating body weight,  no fatigue, no subjective hyperthermia, no subjective hypothermia Eyes: no blurry vision, no xerophthalmia ENT: no sore throat, no nodules palpated in throat, no dysphagia/odynophagia, no hoarseness Cardiovascular: no Chest Pain, no Shortness of Breath, no palpitations, no leg swelling Respiratory: no cough, no shortness of breath Gastrointestinal: no Nausea/Vomiting/Diarhhea Musculoskeletal: no muscle/joint aches Skin: no rashes Neurological: no tremors, no numbness, no tingling, no dizziness Psychiatric: no depression, no anxiety  Objective:       09/18/2022   10:10 AM 07/19/2022    8:40 AM 05/04/2022    3:14 PM  Vitals with BMI  Height 5' 7.25" 5' 7.25"   Weight 183 lbs 180 lbs   BMI 06.23 76.28   Systolic 315 176 160  Diastolic 76 80 737  Pulse 76 72     BP 114/76   Pulse 76   Ht 5' 7.25" (1.708 m)   Wt 183 lb (83 kg)   LMP 05/15/2018 (Exact Date)   BMI 28.45 kg/m   Wt Readings from Last 3 Encounters:  09/18/22 183 lb (83 kg)  07/19/22 180 lb (81.6 kg)  05/04/22 188 lb (85.3 kg)    Physical Exam  Constitutional:  Body mass index is 28.45 kg/m.,  not in acute distress, normal state of mind Eyes: PERRLA, EOMI, no exophthalmos ENT: moist mucous membranes, no gross thyromegaly, no gross cervical lymphadenopathy Cardiovascular: normal precordial activity, Regular Rate and Rhythm, no Murmur/Rubs/Gallops   CMP ( most recent) CMP     Component Value Date/Time   NA 136 05/04/2022 1543   K 3.6 05/04/2022 1543   CL 98 05/04/2022 1543   CO2 31 05/04/2022 1543   GLUCOSE 90 05/04/2022 1543   BUN 14 05/04/2022 1543   CREATININE 1.18 05/04/2022 1543   CALCIUM 10.0 05/04/2022 1543   PROT 8.5 (H) 05/04/2022 1543   ALBUMIN 4.3 05/04/2022 1543   AST 14 05/04/2022 1543   ALT 11 05/04/2022 1543   ALKPHOS 74 05/04/2022 1543   BILITOT 1.1 05/04/2022 1543   GFRNONAA >60 05/24/2018 0522   GFRAA >60 05/24/2018  0522     Diabetic Labs (most recent): Lab Results  Component Value Date   HGBA1C 5.6 05/04/2022   HGBA1C 5.5 09/24/2020   HGBA1C 5.4 11/03/2019     Lipid Panel ( most recent) Lipid Panel     Component Value Date/Time   CHOL 241 (H) 08/18/2022 1147   TRIG 132 08/18/2022 1147   HDL 43 08/18/2022 1147   CHOLHDL 5.6 (H) 08/18/2022 1147   CHOLHDL 5 05/04/2022 1543  VLDL 34.8 05/04/2022 1543   LDLCALC 174 (H) 08/18/2022 1147   LABVLDL 24 08/18/2022 1147      Lab Results  Component Value Date   TSH 1.54 05/04/2022   TSH 2.39 11/03/2019   TSH 2.24 06/17/2018   TSH 1.41 12/20/2016   TSH 1.03 11/18/2015   TSH 1.54 06/10/2015   TSH 1.20 01/26/2014   TSH 1.48 09/03/2012   TSH 1.86 03/26/2007             Component Ref Range & Units 2 mo ago  Aldosterone  ng/dL 18  Comment: . Unable to flag abnormal result(s), please refer     to reference range(s) below: . Adult Reference Ranges for Aldosterone, LC/MS/MS:     Upright  8:00 - 10:00 am    < or = 28 ng/dL     Upright  4:00 -  6:00 pm    < or = 21 ng/dL     Supine   8:00 - 10:00 am       3 - 16 ng/dL .  Renin Activity 0.25 - 5.82 ng/mL/h 0.24 Low   ALDO / PRA Ratio 0.9 - 28.9 Ratio 75.0 High      Recent Results (from the past 2160 hour(s))  Sodium, urine, 24 hour     Status: None   Collection Time: 07/30/22 10:30 AM  Result Value Ref Range   Sodium, Ur 126 Not Estab. mmol/L   Sodium, 24H Ur 164 39 - 258 mmol/24 hr  Creatinine, urine, 24 hour     Status: None   Collection Time: 07/30/22 10:30 AM  Result Value Ref Range   Creatinine, Urine 113.8 Not Estab. mg/dL   Creatinine, 24H Ur 1,479 800 - 1,800 mg/24 hr  Aldosterone, Urine     Status: None   Collection Time: 07/30/22 10:30 AM  Result Value Ref Range   Aldosterone U,Random CANCELED ug/L    Comment: Test not performed. Deterioration occurred during specimen handling.  Result canceled by the ancillary.   Aldosterone + renin activity w/ ratio     Status:  None   Collection Time: 08/18/22 11:47 AM  Result Value Ref Range   Aldosterone 29.3 0.0 - 30.0 ng/dL   Renin Activity, Plasma 1.211 0.167 - 5.380 ng/mL/hr   Aldos/Renin Ratio 24.2 0.0 - 30.0    Comment:                          Units:      ng/dL per ng/mL/hr  Lipid panel     Status: Abnormal   Collection Time: 08/18/22 11:47 AM  Result Value Ref Range   Cholesterol, Total 241 (H) 100 - 199 mg/dL   Triglycerides 132 0 - 149 mg/dL   HDL 43 >39 mg/dL   VLDL Cholesterol Cal 24 5 - 40 mg/dL   LDL Chol Calc (NIH) 174 (H) 0 - 99 mg/dL   Chol/HDL Ratio 5.6 (H) 0.0 - 4.4 ratio    Comment:                                   T. Chol/HDL Ratio                                             Men  Women  1/2 Avg.Risk  3.4    3.3                                   Avg.Risk  5.0    4.4                                2X Avg.Risk  9.6    7.1                                3X Avg.Risk 23.4   11.0      Assessment & Plan:   1. Endocrine disorder, unspecified 2. Essential hypertension, benign 3. Mixed hyperlipidemia  - I have reviewed her  new and available endocrine  records and clinically evaluated the patient. - Based on these reviews, she has had abnormally elevated ratio of ALDO/PRA, prior to her last visit.  However, her repeat labs show normal adrenal function-in terms of aldosterone. She will not need further workup nor adrenal imaging at this time. Her urine electrolytes are also within normal limits. Pretest probability for primary aldosteronism is low.    Her blood pressure is controlled at 114/76, advised to continue her current medications including amlodipine 10 mg p.o. daily   and triamterene/hydrochlorothiazide 37.5/25 mg p.o. once a day.  In light of evidence of metabolic dysfunction including severe hyperlipidemia-now worsening with LDL of 174, hypertension, and the fact that she wants to avoid medications to treat hyperlipidemia, this patient is a good  candidate for lifestyle medicine. She was given a package of lifestyle medicine during her last visit, and patient vows to engage. - she acknowledges that there is a room for improvement in her food and drink choices. - Suggestion is made for her to avoid simple carbohydrates  from her diet including Cakes, Sweet Desserts, Ice Cream, Soda (diet and regular), Sweet Tea, Candies, Chips, Cookies, Store Bought Juices, Alcohol , Artificial Sweeteners,  Coffee Creamer, and "Sugar-free" Products, Lemonade. This will help patient to have more stable blood glucose profile and potentially avoid unintended weight gain.  The following Lifestyle Medicine recommendations according to [place]  [facility]) were discussed and and offered to patient and she  agrees to start the journey:  A. Whole Foods, Plant-Based Nutrition comprising of fruits and vegetables, plant-based proteins, whole-grain carbohydrates was discussed in detail with the patient.   A list for source of those nutrients were also provided to the patient.  Patient will use only water or unsweetened tea for hydration. B.  The need to stay away from risky substances including alcohol, smoking; obtaining 7 to 9 hours of restorative sleep, at least 150 minutes of moderate intensity exercise weekly, the importance of healthy social connections,  and stress management techniques were discussed. C.  A full color page of  Calorie density of various food groups per pound showing examples of each food groups was provided to the patient.  Due to severe hyperlipidemia putting her at risk for cardiovascular morbidity, she will benefit from early initiation of low-dose statin in addition to her lifestyle medicine. Discussed and added Crestor 5 mg p.o. nightly.  Side effects and precautions discussed with her. - she is advised to maintain close follow up with Janith Lima, MD for primary care needs.  I spent 27 minutes  in the care of the  patient today including review of labs from Thyroid Function, CMP, and other relevant labs ; imaging/biopsy records (current and previous including abstractions from other facilities); face-to-face time discussing  her lab results and symptoms, medications doses, her options of short and long term treatment based on the latest standards of care / guidelines;   and documenting the encounter.  Jordynn S Dixson  participated in the discussions, expressed understanding, and voiced agreement with the above plans.  All questions were answered to her satisfaction. she is encouraged to contact clinic should she have any questions or concerns prior to her return visit.   Follow up plan: Return in about 6 months (around 03/19/2023) for Fasting Labs  in AM B4 8.   Glade Lloyd, MD [facility] [address] [place], [place] [address] Phone: [phone]  Fax: [phone]     09/18/2022, 12:54 PM  This note was partially dictated with voice recognition software. Similar sounding words can be transcribed inadequately or may not  be corrected upon review.

## 2022-11-01 ENCOUNTER — Other Ambulatory Visit: Payer: Self-pay | Admitting: Internal Medicine

## 2022-11-01 DIAGNOSIS — I10 Essential (primary) hypertension: Secondary | ICD-10-CM

## 2022-11-06 ENCOUNTER — Other Ambulatory Visit: Payer: Self-pay | Admitting: Internal Medicine

## 2022-11-06 DIAGNOSIS — I10 Essential (primary) hypertension: Secondary | ICD-10-CM

## 2023-02-07 ENCOUNTER — Other Ambulatory Visit: Payer: Self-pay | Admitting: Internal Medicine

## 2023-02-07 DIAGNOSIS — I10 Essential (primary) hypertension: Secondary | ICD-10-CM

## 2023-03-08 ENCOUNTER — Other Ambulatory Visit: Payer: Self-pay

## 2023-03-08 DIAGNOSIS — E782 Mixed hyperlipidemia: Secondary | ICD-10-CM

## 2023-03-08 DIAGNOSIS — E349 Endocrine disorder, unspecified: Secondary | ICD-10-CM

## 2023-03-10 ENCOUNTER — Other Ambulatory Visit: Payer: Self-pay | Admitting: Internal Medicine

## 2023-03-10 DIAGNOSIS — I10 Essential (primary) hypertension: Secondary | ICD-10-CM

## 2023-03-10 LAB — COMPREHENSIVE METABOLIC PANEL
ALT: 13 IU/L (ref 0–32)
AST: 19 IU/L (ref 0–40)
Albumin: 4.5 g/dL (ref 3.8–4.9)
Alkaline Phosphatase: 92 IU/L (ref 44–121)
BUN/Creatinine Ratio: 11 (ref 9–23)
BUN: 14 mg/dL (ref 6–24)
Bilirubin Total: 1.4 mg/dL — ABNORMAL HIGH (ref 0.0–1.2)
CO2: 27 mmol/L (ref 20–29)
Calcium: 10.4 mg/dL — ABNORMAL HIGH (ref 8.7–10.2)
Chloride: 99 mmol/L (ref 96–106)
Creatinine, Ser: 1.29 mg/dL — ABNORMAL HIGH (ref 0.57–1.00)
Globulin, Total: 3.4 g/dL (ref 1.5–4.5)
Glucose: 97 mg/dL (ref 70–99)
Potassium: 3.9 mmol/L (ref 3.5–5.2)
Sodium: 140 mmol/L (ref 134–144)
Total Protein: 7.9 g/dL (ref 6.0–8.5)
eGFR: 50 mL/min/{1.73_m2} — ABNORMAL LOW (ref >59)

## 2023-03-10 LAB — LIPID PANEL
Chol/HDL Ratio: 4 ratio (ref 0.0–4.4)
Cholesterol, Total: 168 mg/dL (ref 100–199)
HDL: 42 mg/dL (ref >39)
LDL Chol Calc (NIH): 106 mg/dL — ABNORMAL HIGH (ref 0–99)
Triglycerides: 111 mg/dL (ref 0–149)
VLDL Cholesterol Cal: 20 mg/dL (ref 5–40)

## 2023-03-13 ENCOUNTER — Other Ambulatory Visit: Payer: Self-pay | Admitting: Internal Medicine

## 2023-03-13 DIAGNOSIS — I10 Essential (primary) hypertension: Secondary | ICD-10-CM

## 2023-03-19 ENCOUNTER — Ambulatory Visit: Payer: BC Managed Care – PPO | Admitting: "Endocrinology

## 2023-03-19 ENCOUNTER — Encounter: Payer: Self-pay | Admitting: "Endocrinology

## 2023-03-19 VITALS — BP 110/84 | HR 76 | Ht 67.25 in | Wt 189.6 lb

## 2023-03-19 DIAGNOSIS — I10 Essential (primary) hypertension: Secondary | ICD-10-CM

## 2023-03-19 DIAGNOSIS — E782 Mixed hyperlipidemia: Secondary | ICD-10-CM

## 2023-03-19 DIAGNOSIS — E349 Endocrine disorder, unspecified: Secondary | ICD-10-CM | POA: Diagnosis not present

## 2023-03-19 NOTE — Progress Notes (Signed)
03/19/2023, 6:42 PM  Endocrinology follow-up note   Subjective:    Patient ID: Tabitha Matthews, female    DOB: 08/04/1971, PCP Etta Grandchild, MD   Past Medical History:  Diagnosis Date   Anemia    Hyperlipidemia    Hypertension 05/17/2018   IDA (iron deficiency anemia) 05/17/2018   Past Surgical History:  Procedure Laterality Date   CESAREAN SECTION     ROBOTIC ASSISTED LAPAROSCOPIC HYSTERECTOMY AND SALPINGECTOMY Bilateral 05/23/2018   Procedure: XI ROBOTIC ASSISTED LAPAROSCOPIC HYSTERECTOMY AND SALPINGECTOMY;  Surgeon: Maxie Better, MD;  Location: [facility];  Service: Gynecology;  Laterality: Bilateral;   Social History   Socioeconomic History   Marital status: Married    Spouse name: Not on file   Number of children: Not on file   Years of education: Not on file   Highest education level: Not on file  Occupational History   Not on file  Tobacco Use   Smoking status: Never    Passive exposure: Never   Smokeless tobacco: Never  Vaping Use   Vaping status: Never Used  Substance and Sexual Activity   Alcohol use: No   Drug use: No   Sexual activity: Yes    Partners: Male    Birth control/protection: I.U.D.  Other Topics Concern   Not on file  Social History Narrative   Not on file   Social Determinants of Health   Financial Resource Strain: Not on file  Food Insecurity: Not on file  Transportation Needs: Not on file  Physical Activity: Not on file  Stress: Not on file  Social Connections: Not on file   Family History  Problem Relation Age of Onset   Cancer Mother    Kidney disease Mother    Hyperlipidemia Mother    Hypertension Mother    Heart failure Mother    Hyperlipidemia Father    Hypertension Father    Depression Brother    Alcohol abuse Neg Hx    COPD Neg Hx    Diabetes Neg Hx    Early death Neg Hx    Heart disease Neg Hx    Stroke Neg Hx    Breast cancer Neg Hx    Outpatient  Encounter Medications as of 03/19/2023  Medication Sig   amLODipine (NORVASC) 10 MG tablet TAKE 1 TABLET BY MOUTH EVERY DAY   escitalopram (LEXAPRO) 10 MG tablet Take 20 mg by mouth daily.   rosuvastatin (CRESTOR) 5 MG tablet Take 1 tablet (5 mg total) by mouth at bedtime.   triamterene-hydrochlorothiazide (MAXZIDE-25) 37.5-25 MG tablet TAKE 1 TABLET BY MOUTH EVERY DAY   No facility-administered encounter medications on file as of 03/19/2023.   ALLERGIES: No Known Allergies  VACCINATION STATUS: Immunization History  Administered Date(s) Administered   Moderna Sars-Covid-2 Vaccination 10/17/2019, 11/14/2019   PPD Test 12/20/2016   Tdap 01/26/2014   Zoster Recombinant(Shingrix) 05/04/2022    HPI Tabitha Matthews is 52 y.o. female who presents today with a medical history as above. she is being seen in follow-up after she was seen in consultation for elevated PEC / PRA ratio requested by Etta Grandchild, MD.   On a routine blood work in September she was found to  have 5 - with addition of 18, plasma renin activity of 0.2, which lead to elevated ratio 75. Her repeat labs continue to show complete normalization of these numbers.  Her previsit lipid panel also shows significant improvement in her LDL to 106 from 174.  She is tolerating low-dose Crestor 5 mg p.o. nightly.  She has no new complaints today. She has high blood pressure on medications including amlodipine 10 mg, and triamterene/HCTZ 37.5/25 mg p.o. once a day.  She denies any prior history of uncontrolled blood pressure.  She was diagnosed in her mid 30s.  She is not known to have adrenal adenoma.  She did have mild hypokalemia which required intermittent supplement with potassium.  She does have family history of hypertension, hyperlipidemia and heart failure. She denies coronary disease, CVA.  She does have uncontrolled hyperlipidemia not on treatment. She does not have recent abdominal imaging specifically no adrenal imaging  studies.  She does not have acute complaints today. She is a non-smoker.   Review of Systems  Constitutional: + Minimally fluctuating body weight,  no fatigue, no subjective hyperthermia, no subjective hypothermia Eyes: no blurry vision, no xerophthalmia   Objective:       03/19/2023   10:43 AM 09/18/2022   10:10 AM 07/19/2022    8:40 AM  Vitals with BMI  Height 5' 7.25" 5' 7.25" 5' 7.25"  Weight 189 lbs 10 oz 183 lbs 180 lbs  BMI 29.48 28.45 27.99  Systolic 110 114 664  Diastolic 84 76 80  Pulse 76 76 72    BP 110/84   Pulse 76   Ht 5' 7.25" (1.708 m)   Wt 189 lb 9.6 oz (86 kg)   LMP 05/15/2018 (Exact Date)   BMI 29.48 kg/m   Wt Readings from Last 3 Encounters:  03/19/23 189 lb 9.6 oz (86 kg)  09/18/22 183 lb (83 kg)  07/19/22 180 lb (81.6 kg)    Physical Exam  Constitutional:  Body mass index is 29.48 kg/m.,  not in acute distress, normal state of mind Eyes: PERRLA, EOMI, no exophthalmos ENT: moist mucous membranes, no gross thyromegaly, no gross cervical lymphadenopathy Cardiovascular: normal precordial activity, Regular Rate and Rhythm, no Murmur/Rubs/Gallops   CMP ( most recent) CMP     Component Value Date/Time   NA 140 03/09/2023 1257   K 3.9 03/09/2023 1257   CL 99 03/09/2023 1257   CO2 27 03/09/2023 1257   GLUCOSE 97 03/09/2023 1257   GLUCOSE 90 05/04/2022 1543   BUN 14 03/09/2023 1257   CREATININE 1.29 (H) 03/09/2023 1257   CALCIUM 10.4 (H) 03/09/2023 1257   PROT 7.9 03/09/2023 1257   ALBUMIN 4.5 03/09/2023 1257   AST 19 03/09/2023 1257   ALT 13 03/09/2023 1257   ALKPHOS 92 03/09/2023 1257   BILITOT 1.4 (H) 03/09/2023 1257   GFRNONAA >60 05/24/2018 0522   GFRAA >60 05/24/2018 0522     Diabetic Labs (most recent): Lab Results  Component Value Date   HGBA1C 5.6 05/04/2022   HGBA1C 5.5 09/24/2020   HGBA1C 5.4 11/03/2019     Lipid Panel ( most recent) Lipid Panel     Component Value Date/Time   CHOL 168 03/09/2023 1257   TRIG 111  03/09/2023 1257   HDL 42 03/09/2023 1257   CHOLHDL 4.0 03/09/2023 1257   CHOLHDL 5 05/04/2022 1543   VLDL 34.8 05/04/2022 1543   LDLCALC 106 (H) 03/09/2023 1257   LABVLDL 20 03/09/2023 1257      Lab  Results  Component Value Date   TSH 1.54 05/04/2022   TSH 2.39 11/03/2019   TSH 2.24 06/17/2018   TSH 1.41 12/20/2016   TSH 1.03 11/18/2015   TSH 1.54 06/10/2015   TSH 1.20 01/26/2014   TSH 1.48 09/03/2012   TSH 1.86 03/26/2007             Component Ref Range & Units 2 mo ago  Aldosterone  ng/dL 18  Comment: . Unable to flag abnormal result(s), please refer     to reference range(s) below: . Adult Reference Ranges for Aldosterone, LC/MS/MS:     Upright  8:00 - 10:00 am    < or = 28 ng/dL     Upright  4:09 -  8:11 pm    < or = 21 ng/dL     Supine   9:14 - 78:29 am       3 - 16 ng/dL .  Renin Activity 0.25 - 5.82 ng/mL/h 0.24 Low   ALDO / PRA Ratio 0.9 - 28.9 Ratio 75.0 High      Recent Results (from the past 2160 hour(s))  Comprehensive metabolic panel     Status: Abnormal   Collection Time: 03/09/23 12:57 PM  Result Value Ref Range   Glucose 97 70 - 99 mg/dL   BUN 14 6 - 24 mg/dL   Creatinine, Ser 5.62 (H) 0.57 - 1.00 mg/dL   eGFR 50 (L) >13 YQ/MVH/8.46   BUN/Creatinine Ratio 11 9 - 23   Sodium 140 134 - 144 mmol/L   Potassium 3.9 3.5 - 5.2 mmol/L   Chloride 99 96 - 106 mmol/L   CO2 27 20 - 29 mmol/L   Calcium 10.4 (H) 8.7 - 10.2 mg/dL   Total Protein 7.9 6.0 - 8.5 g/dL   Albumin 4.5 3.8 - 4.9 g/dL   Globulin, Total 3.4 1.5 - 4.5 g/dL   Bilirubin Total 1.4 (H) 0.0 - 1.2 mg/dL   Alkaline Phosphatase 92 44 - 121 IU/L   AST 19 0 - 40 IU/L   ALT 13 0 - 32 IU/L  Lipid Panel     Status: Abnormal   Collection Time: 03/09/23 12:57 PM  Result Value Ref Range   Cholesterol, Total 168 100 - 199 mg/dL   Triglycerides 962 0 - 149 mg/dL   HDL 42 >95 mg/dL   VLDL Cholesterol Cal 20 5 - 40 mg/dL   LDL Chol Calc (NIH) 284 (H) 0 - 99 mg/dL   Chol/HDL Ratio 4.0 0.0 - 4.4  ratio    Comment:                                   T. Chol/HDL Ratio                                             Men  Women                               1/2 Avg.Risk  3.4    3.3                                   Avg.Risk  5.0    4.4  2X Avg.Risk  9.6    7.1                                3X Avg.Risk 23.4   11.0      Assessment & Plan:   1. Endocrine disorder, unspecified 2. Essential hypertension, benign 3. Mixed hyperlipidemia  - I have reviewed her  new and available endocrine  records and clinically evaluated the patient. - Based on these reviews, she has had abnormally elevated ratio of ALDO/PRA, prior to her last visit.  However, her repeat labs continue to show normal adrenal function-in terms of aldosterone. She will not need further workup nor adrenal imaging at this time. Her urine electrolytes are also within normal limits. Pretest probability for primary aldosteronism is low.    Her blood pressure is controlled at 114/76, advised to continue her current medications including amlodipine 10 mg p.o. daily   and triamterene/hydrochlorothiazide 37.5/25 mg p.o. once a day.  In light of evidence of metabolic dysfunction including severe hyperlipidemia she was offered lifestyle medicine and low-dose Crestor to which she is responding adequately.   - she acknowledges that there is a room for improvement in her food and drink choices. - Suggestion is made for her to avoid simple carbohydrates  from her diet including Cakes, Sweet Desserts, Ice Cream, Soda (diet and regular), Sweet Tea, Candies, Chips, Cookies, Store Bought Juices, Alcohol , Artificial Sweeteners,  Coffee Creamer, and "Sugar-free" Products, Lemonade. This will help patient to have more stable blood glucose profile and potentially avoid unintended weight gain.  The following Lifestyle Medicine recommendations according to American College of Lifestyle Medicine  [facility]) were discussed and  and offered to patient and she  agrees to start the journey:  A. Whole Foods, Plant-Based Nutrition comprising of fruits and vegetables, plant-based proteins, whole-grain carbohydrates was discussed in detail with the patient.   A list for source of those nutrients were also provided to the patient.  Patient will use only water or unsweetened tea for hydration. B.  The need to stay away from risky substances including alcohol, smoking; obtaining 7 to 9 hours of restorative sleep, at least 150 minutes of moderate intensity exercise weekly, the importance of healthy social connections,  and stress management techniques were discussed. C.  A full color page of  Calorie density of various food groups per pound showing examples of each food groups was provided to the patient.   Due to severe hyperlipidemia putting her at risk for cardiovascular morbidity, she will benefit from early initiation of low-dose statin in addition to her lifestyle medicine.  She is advised to continue Crestor 5 mg p.o. nightly. Discussed and added Crestor 5 mg p.o. nightly.  Side effects and precautions discussed with her. - she is advised to maintain close follow up with Etta Grandchild, MD for primary care needs.   I spent  26  minutes in the care of the patient today including review of labs from Thyroid Function, CMP, and other relevant labs ; imaging/biopsy records (current and previous including abstractions from other facilities); face-to-face time discussing  her lab results and symptoms, medications doses, her options of short and long term treatment based on the latest standards of care / guidelines;   and documenting the encounter.  Tabitha Matthews  participated in the discussions, expressed understanding, and voiced agreement with the above plans.  All questions were answered to her satisfaction. she  is encouraged to contact clinic should she have any questions or concerns prior to her return visit.   Follow up  plan: Return in about 6 months (around 09/19/2023) for Fasting Labs  in AM B4 8.   Marquis Lunch, MD [facility] [address] [place], [place] [address] Phone: [phone]  Fax: [phone]     03/19/2023, 6:42 PM  This note was partially dictated with voice recognition software. Similar sounding words can be transcribed inadequately or may not  be corrected upon review.

## 2023-03-20 ENCOUNTER — Other Ambulatory Visit: Payer: Self-pay | Admitting: Internal Medicine

## 2023-03-20 DIAGNOSIS — I10 Essential (primary) hypertension: Secondary | ICD-10-CM

## 2023-03-26 NOTE — Telephone Encounter (Signed)
Patient has appointment scheduled for Monday 04/02/2023.  She would like you to send in enough medication to get her till this appointment.

## 2023-03-28 ENCOUNTER — Telehealth: Payer: Self-pay | Admitting: Internal Medicine

## 2023-03-28 NOTE — Telephone Encounter (Signed)
Prescription Request  03/28/2023  LOV: 05/04/2022  What is the name of the medication or equipment? triamterene-hydrochlorothiazide (MAXZIDE-25) 37.5-25 MG tablet   Have you contacted your pharmacy to request a refill? No   Which pharmacy would you like this sent to?  [id number] [place], [place] - [address] [place] [address] Phone: [phone] Fax: [phone]    Patient notified that their request is being sent to the clinical staff for review and that they should receive a response within 2 business days.   Please advise at Mobile [phone] (mobile)

## 2023-04-02 ENCOUNTER — Encounter: Payer: Self-pay | Admitting: Internal Medicine

## 2023-04-02 ENCOUNTER — Ambulatory Visit (INDEPENDENT_AMBULATORY_CARE_PROVIDER_SITE_OTHER): Payer: BC Managed Care – PPO | Admitting: Internal Medicine

## 2023-04-02 VITALS — BP 128/88 | HR 95 | Temp 98.0°F | Resp 16 | Ht 67.25 in | Wt 189.0 lb

## 2023-04-02 DIAGNOSIS — I10 Essential (primary) hypertension: Secondary | ICD-10-CM | POA: Diagnosis not present

## 2023-04-02 DIAGNOSIS — Z23 Encounter for immunization: Secondary | ICD-10-CM | POA: Diagnosis not present

## 2023-04-02 DIAGNOSIS — N1831 Chronic kidney disease, stage 3a: Secondary | ICD-10-CM | POA: Insufficient documentation

## 2023-04-02 DIAGNOSIS — Z1211 Encounter for screening for malignant neoplasm of colon: Secondary | ICD-10-CM

## 2023-04-02 DIAGNOSIS — Z1159 Encounter for screening for other viral diseases: Secondary | ICD-10-CM | POA: Insufficient documentation

## 2023-04-02 MED ORDER — AMLODIPINE BESYLATE 10 MG PO TABS
10.0000 mg | ORAL_TABLET | Freq: Every day | ORAL | 1 refills | Status: DC
Start: 2023-04-02 — End: 2023-06-22

## 2023-04-02 NOTE — Patient Instructions (Signed)
 Chronic Kidney Disease, Adult Chronic kidney disease (CKD) occurs when the kidneys are slowly and permanently damaged over a long period of time. The kidneys are a pair of organs that do many important jobs in the body, including: Removing waste and extra fluid from the blood to make urine. Making hormones that maintain the amount of fluid in tissues and blood vessels. Maintaining the right amount of fluids and chemicals in the body. A small amount of kidney damage may not cause problems, but a large amount of damage may make it hard or impossible for the kidneys to work right. Steps must be taken to slow kidney damage or to stop it from getting worse. If steps are not taken, the kidneys may stop working permanently (end-stage renal disease, or ESRD). Most of the time, CKD does not go away, but it can often be controlled. People who have CKD are usually able to live full lives. What are the causes? The most common causes of this condition are diabetes and high blood pressure (hypertension). Other causes include: Cardiovascular diseases. These affect the heart and blood vessels. Kidney diseases. These include: Glomerulonephritis, or inflammation of the tiny filters in the kidneys. Interstitial nephritis. This is swelling of the small tubes of the kidneys and of the surrounding structures. Polycystic kidney disease, in which clusters of fluid-filled sacs form within the kidneys. Renal vascular disease. This includes disorders that affect the arteries and veins of the kidneys. Diseases that affect the body's defense system (immune system). A problem with urine flow. This may be caused by: Kidney stones. Cancer. An enlarged prostate, in males. A kidney infection or urinary tract infection (UTI) that keeps coming back. Vasculitis. This is swelling or inflammation of the blood vessels. What increases the risk? Your chances of having kidney disease increase with age. The following factors may make  you more likely to develop this condition: A family history of kidney disease or kidney failure. Kidney failure means the kidneys can no longer work right. Certain genetic diseases. Taking medicines often that are damaging to the kidneys. Being around or being in contact with toxic substances. Obesity. A history of tobacco use. What are the signs or symptoms? Symptoms of this condition include: Feeling very tired (lethargic) and having less energy. Swelling, or edema, of the face, legs, ankles, or feet. Nausea or vomiting, or loss of appetite. Confusion or trouble concentrating. Muscle twitches and cramps, especially in the legs. Dry, itchy skin. A metallic taste in the mouth. Producing less urine, or producing more urine (especially at night). Shortness of breath. Trouble sleeping. CKD may also result in not having enough red blood cells or hemoglobin in the blood (anemia) or having weak bones (bone disease). Symptoms develop slowly and may not be obvious until the kidney damage becomes severe. It is possible to have kidney disease for years without having symptoms. How is this diagnosed? This condition may be diagnosed based on: Blood tests. Urine tests. Imaging tests, such as an ultrasound or a CT scan. A kidney biopsy. This involves removing a sample of kidney tissue to be looked at under a microscope. Results from these tests will help to determine how serious the CKD is. How is this treated? There is no cure for most cases of this condition, but treatment usually relieves symptoms and prevents or slows the worsening of the disease. Treatment may include: Diet changes, which may require you to avoid alcohol and foods that are high in salt, potassium, phosphorous, and protein. Medicines. These may:  Lower blood pressure. Control blood sugar (glucose). Relieve anemia. Relieve swelling. Protect your bones. Improve the balance of salts and minerals in your blood  (electrolytes). Dialysis, which is a type of treatment that removes toxic waste from the body. It may be needed if you have kidney failure. Managing any other conditions that are causing your CKD or making it worse. Follow these instructions at home: Medicines Take over-the-counter and prescription medicines only as told by your health care provider. The amount of some medicines that you take may need to be changed. Do not take any new medicines unless approved by your health care provider. Many medicines can make kidney damage worse. Do not take any vitamin and mineral supplements unless approved by your health care provider. Many nutritional supplements can make kidney damage worse. Lifestyle  Do not use any products that contain nicotine or tobacco, such as cigarettes, e-cigarettes, and chewing tobacco. If you need help quitting, ask your health care provider. If you drink alcohol: Limit how much you use to: 0-1 drink a day for women who are not pregnant. 0-2 drinks a day for men. Know how much alcohol is in your drink. In the [place] one drink equals one 12 oz bottle of beer (355 mL), one 5 oz glass of wine (148 mL), or one 1 oz glass of hard liquor (44 mL). Maintain a healthy weight. If you need help, ask your health care provider. General instructions  Follow instructions from your health care provider about eating or drinking restrictions, including any prescribed diet. Track your blood pressure at home. Report changes in your blood pressure as told. If you are being treated for diabetes, track your blood glucose levels as told. Start or continue an exercise plan. Exercise at least 30 minutes a day, 5 days a week. Keep your immunizations up to date as told. Keep all follow-up visits. This is important. Where to find more information American Association of Kidney Patients: [employer].is [employer]: www.kidney.org American Kidney Fund: FightingMatch.com.ee Life Options:  www.lifeoptions.org Kidney School: www.kidneyschool.org Contact a health care provider if: Your symptoms get worse. You develop new symptoms. Get help right away if: You develop symptoms of ESRD. These include: Headaches. Numbness in your hands or feet. Easy bruising. Frequent hiccups. Chest pain. Shortness of breath. Lack of menstrual periods, in women. You have a fever. You are producing less urine than usual. You have pain or bleeding when you urinate or when you have a bowel movement. These symptoms may represent a serious problem that is an emergency. Do not wait to see if the symptoms will go away. Get medical help right away. Call your local emergency services (911 in the [place] Do not drive yourself to the hospital. Summary Chronic kidney disease (CKD) occurs when the kidneys become damaged slowly over a long period of time. The most common causes of this condition are diabetes and high blood pressure (hypertension). There is no cure for most cases of CKD, but treatment usually relieves symptoms and prevents or slows the worsening of the disease. Treatment may include a combination of lifestyle changes, medicines, and dialysis. This information is not intended to replace advice given to you by your health care provider. Make sure you discuss any questions you have with your health care provider. Document Revised: 11/07/2019 Document Reviewed: 11/12/2019 Elsevier Patient Education  2024 [employer].

## 2023-04-02 NOTE — Progress Notes (Unsigned)
Subjective:  Patient ID: Tabitha Matthews, female    DOB: 1970-10-24  Age: 52 y.o. MRN: [id number]  CC: Hypertension   HPI Tabitha Matthews presents for f/up -----  Discussed the use of AI scribe software for clinical note transcription with the patient, who gave verbal consent to proceed.  History of Present Illness   The patient, with a history of hypertension, presents in a generally good state of health. They report a recent loss of their mother due to end-stage renal failure and stage four cancer, which has led to intermittent emotional distress. The patient has been experiencing episodes of low blood pressure, with a recent reading of 110/76, but denies any associated symptoms such as dizziness or lightheadedness. They have not been able to refill their diuretic prescription for the past five days.  The patient recently saw an endocrinologist for a referral made in September due to suspected hyperaldosteronism. However, this condition was ruled out. They have been consistently taking amlodipine for their hypertension.  The patient has not yet been screened for colon cancer. They were supposed to complete a home test but got sidetracked due to personal circumstances. After discussing the options, they have decided to proceed with the Cologuard home test. They deny any family history of colon cancer or any symptoms such as blood in the stool.       Outpatient Medications Prior to Visit  Medication Sig Dispense Refill   escitalopram (LEXAPRO) 10 MG tablet Take 20 mg by mouth daily.     rosuvastatin (CRESTOR) 5 MG tablet Take 1 tablet (5 mg total) by mouth at bedtime. 90 tablet 1   amLODipine (NORVASC) 10 MG tablet TAKE 1 TABLET BY MOUTH EVERY DAY 90 tablet 0   triamterene-hydrochlorothiazide (MAXZIDE-25) 37.5-25 MG tablet TAKE 1 TABLET BY MOUTH EVERY DAY 90 tablet 0   No facility-administered medications prior to visit.    ROS Review of Systems  Objective:  BP 128/88 (BP  Location: Right Arm, Patient Position: Sitting, Cuff Size: Large)   Pulse 95   Temp 98 F (36.7 C) (Oral)   Resp 16   Ht 5' 7.25" (1.708 m)   Wt 189 lb (85.7 kg)   LMP 05/15/2018 (Exact Date)   SpO2 97%   BMI 29.38 kg/m   BP Readings from Last 3 Encounters:  04/02/23 128/88  03/19/23 110/84  09/18/22 114/76    Wt Readings from Last 3 Encounters:  04/02/23 189 lb (85.7 kg)  03/19/23 189 lb 9.6 oz (86 kg)  09/18/22 183 lb (83 kg)    Physical Exam  Lab Results  Component Value Date   WBC 4.5 05/04/2022   HGB 13.0 05/04/2022   HCT 37.7 05/04/2022   PLT 352.0 05/04/2022   GLUCOSE 97 03/09/2023   CHOL 168 03/09/2023   TRIG 111 03/09/2023   HDL 42 03/09/2023   LDLCALC 106 (H) 03/09/2023   ALT 13 03/09/2023   AST 19 03/09/2023   NA 140 03/09/2023   K 3.9 03/09/2023   CL 99 03/09/2023   CREATININE 1.29 (H) 03/09/2023   BUN 14 03/09/2023   CO2 27 03/09/2023   TSH 1.54 05/04/2022   HGBA1C 5.6 05/04/2022    MM 3D SCREEN BREAST BILATERAL  Result Date: 07/07/2022 CLINICAL DATA:  Screening. EXAM: DIGITAL SCREENING BILATERAL MAMMOGRAM WITH TOMOSYNTHESIS AND CAD TECHNIQUE: Bilateral screening digital craniocaudal and mediolateral oblique mammograms were obtained. Bilateral screening digital breast tomosynthesis was performed. The images were evaluated with computer-aided detection. COMPARISON:  Previous exam(s).  ACR Breast Density Category c: The breast tissue is heterogeneously dense, which may obscure small masses. FINDINGS: There are no findings suspicious for malignancy. IMPRESSION: No mammographic evidence of malignancy. A result letter of this screening mammogram will be mailed directly to the patient. RECOMMENDATION: Screening mammogram in one year. (Code:SM-B-01Y) BI-RADS CATEGORY  1: Negative. Electronically Signed   By: Norva Pavlov M.D.   On: 07/07/2022 12:49    Assessment & Plan:  Need for hepatitis C screening test -     Hepatitis C antibody; Future  Stage  3a chronic kidney disease (HCC) -     CBC with Differential/Platelet; Future -     Urinalysis, Routine w reflex microscopic; Future  Essential hypertension, benign -     CBC with Differential/Platelet; Future -     TSH; Future -     Urinalysis, Routine w reflex microscopic; Future -     amLODIPine Besylate; Take 1 tablet (10 mg total) by mouth daily.  Dispense: 90 tablet; Refill: 1  Screen for colon cancer -     Cologuard  Essential hypertension  Other orders -     Varicella-zoster vaccine IM     Follow-up: Return in about 4 months (around 08/02/2023).  Sanda Linger, MD

## 2023-05-14 ENCOUNTER — Telehealth: Payer: Self-pay | Admitting: Internal Medicine

## 2023-05-14 NOTE — Telephone Encounter (Signed)
Pt had a appointment for a med renewal and got her shingles shot the same day and was wondering when do she need to set up her next appointment for shingles or would it be done on the day of her follow up?

## 2023-05-14 NOTE — Telephone Encounter (Signed)
Pt has been informed that she can get her 2nd dose of Shingrix on her upcoming 6mo OV with PCP on 12/12.

## 2023-05-17 ENCOUNTER — Other Ambulatory Visit: Payer: Self-pay | Admitting: "Endocrinology

## 2023-05-21 LAB — COLOGUARD: COLOGUARD: NEGATIVE

## 2023-06-02 ENCOUNTER — Other Ambulatory Visit: Payer: Self-pay | Admitting: Internal Medicine

## 2023-06-02 DIAGNOSIS — Z0000 Encounter for general adult medical examination without abnormal findings: Secondary | ICD-10-CM

## 2023-06-02 DIAGNOSIS — Z1231 Encounter for screening mammogram for malignant neoplasm of breast: Secondary | ICD-10-CM

## 2023-06-22 ENCOUNTER — Other Ambulatory Visit: Payer: Self-pay | Admitting: Internal Medicine

## 2023-06-22 ENCOUNTER — Ambulatory Visit: Payer: BC Managed Care – PPO

## 2023-06-22 ENCOUNTER — Encounter: Payer: Self-pay | Admitting: Internal Medicine

## 2023-06-22 DIAGNOSIS — I10 Essential (primary) hypertension: Secondary | ICD-10-CM

## 2023-06-22 DIAGNOSIS — N1831 Chronic kidney disease, stage 3a: Secondary | ICD-10-CM

## 2023-06-22 MED ORDER — TORSEMIDE 20 MG PO TABS: 20.0000 mg | ORAL_TABLET | Freq: Every day | ORAL | 0 refills | Status: DC

## 2023-07-03 LAB — HM PAP SMEAR

## 2023-07-10 ENCOUNTER — Encounter: Payer: Self-pay | Admitting: "Endocrinology

## 2023-07-11 ENCOUNTER — Other Ambulatory Visit: Payer: Self-pay

## 2023-07-11 MED ORDER — ROSUVASTATIN CALCIUM 5 MG PO TABS: 5.0000 mg | ORAL_TABLET | Freq: Every day | ORAL | 0 refills | Status: DC

## 2023-07-23 ENCOUNTER — Ambulatory Visit
Admission: RE | Admit: 2023-07-23 | Discharge: 2023-07-23 | Disposition: A | Discharge status: Home / Self Care | Payer: BC Managed Care – PPO | Source: Ambulatory Visit | Attending: Internal Medicine | Admitting: Internal Medicine

## 2023-07-23 ENCOUNTER — Encounter (HOSPITAL_COMMUNITY): Payer: BC Managed Care – PPO

## 2023-07-23 DIAGNOSIS — Z0000 Encounter for general adult medical examination without abnormal findings: Secondary | ICD-10-CM

## 2023-08-02 ENCOUNTER — Ambulatory Visit (INDEPENDENT_AMBULATORY_CARE_PROVIDER_SITE_OTHER): Payer: BC Managed Care – PPO | Admitting: Internal Medicine

## 2023-08-02 ENCOUNTER — Telehealth: Payer: Self-pay | Admitting: Internal Medicine

## 2023-08-02 VITALS — BP 154/100 | HR 78 | Temp 97.9°F | Ht 67.25 in | Wt 186.6 lb

## 2023-08-02 DIAGNOSIS — E876 Hypokalemia: Secondary | ICD-10-CM

## 2023-08-02 DIAGNOSIS — Z0000 Encounter for general adult medical examination without abnormal findings: Secondary | ICD-10-CM | POA: Diagnosis not present

## 2023-08-02 DIAGNOSIS — N182 Chronic kidney disease, stage 2 (mild): Secondary | ICD-10-CM | POA: Diagnosis not present

## 2023-08-02 DIAGNOSIS — Z0001 Encounter for general adult medical examination with abnormal findings: Secondary | ICD-10-CM | POA: Insufficient documentation

## 2023-08-02 DIAGNOSIS — I10 Essential (primary) hypertension: Secondary | ICD-10-CM | POA: Diagnosis not present

## 2023-08-02 DIAGNOSIS — Z23 Encounter for immunization: Secondary | ICD-10-CM | POA: Diagnosis not present

## 2023-08-02 LAB — BASIC METABOLIC PANEL
BUN: 13 mg/dL (ref 6–23)
CO2: 31 meq/L (ref 19–32)
Calcium: 9.8 mg/dL (ref 8.4–10.5)
Chloride: 98 meq/L (ref 96–112)
Creatinine, Ser: 1.27 mg/dL — ABNORMAL HIGH (ref 0.40–1.20)
GFR: 48.4 mL/min — ABNORMAL LOW (ref >60.00)
Glucose, Bld: 106 mg/dL — ABNORMAL HIGH (ref 70–99)
Potassium: 3 meq/L — ABNORMAL LOW (ref 3.5–5.1)
Sodium: 140 meq/L (ref 135–145)

## 2023-08-02 LAB — URINALYSIS, ROUTINE W REFLEX MICROSCOPIC
Bilirubin Urine: NEGATIVE
Hgb urine dipstick: NEGATIVE
Ketones, ur: NEGATIVE
Leukocytes,Ua: NEGATIVE
Nitrite: NEGATIVE
RBC / HPF: NONE SEEN (ref 0–?)
Specific Gravity, Urine: 1.015 (ref 1.000–1.030)
Total Protein, Urine: NEGATIVE
Urine Glucose: NEGATIVE
Urobilinogen, UA: 0.2 (ref 0.0–1.0)
pH: 7 (ref 5.0–8.0)

## 2023-08-02 MED ORDER — AMLODIPINE BESYLATE 5 MG PO TABS: 5.0000 mg | ORAL_TABLET | Freq: Every day | ORAL | 0 refills | Status: DC

## 2023-08-02 MED ORDER — SPIRONOLACTONE 50 MG PO TABS: 50.0000 mg | ORAL_TABLET | Freq: Every day | ORAL | 0 refills | Status: DC

## 2023-08-02 NOTE — Telephone Encounter (Signed)
Which one would you like?

## 2023-08-02 NOTE — Progress Notes (Signed)
Subjective:  Patient ID: Tabitha Matthews, female    DOB: Oct 22, 1970  Age: 52 y.o. MRN: [id number]  CC: Hypertension and Annual Exam   HPI Tabitha Matthews presents for a CPX and f/up ---  Discussed the use of AI scribe software for clinical note transcription with the patient, who gave verbal consent to proceed.  History of Present Illness   The patient, with a history of hypertension, recently experienced a change in her blood pressure medication. Since the change, she has noticed an elevation in her blood pressure readings and has felt "jittery." She also reports a recent episode of a cold, characterized by a runny nose and a cough, which resolved about a week ago without any associated fever, chills, or significant shortness of breath.  The patient denies any symptoms of chest pain or shortness of breath during physical activity, which includes light walking around her neighborhood three times a week. She also denies any dizziness or lightheadedness, which could be potential side effects of her diuretic medication.       Outpatient Medications Prior to Visit  Medication Sig Dispense Refill   escitalopram (LEXAPRO) 10 MG tablet Take 20 mg by mouth daily.     rosuvastatin (CRESTOR) 5 MG tablet Take 1 tablet (5 mg total) by mouth daily. 90 tablet 0   torsemide (DEMADEX) 20 MG tablet Take 1 tablet (20 mg total) by mouth daily. 90 tablet 0   No facility-administered medications prior to visit.    ROS Review of Systems  Constitutional:  Negative for chills, diaphoresis, fatigue and fever.  HENT: Negative.  Negative for trouble swallowing and voice change.   Eyes: Negative.   Respiratory: Negative.  Negative for cough, choking, shortness of breath and wheezing.   Cardiovascular:  Negative for chest pain, palpitations and leg swelling.  Gastrointestinal: Negative.  Negative for abdominal pain, diarrhea, nausea and vomiting.  Endocrine: Negative.   Genitourinary: Negative.  Negative  for difficulty urinating, dysuria and hematuria.  Musculoskeletal: Negative.  Negative for arthralgias and joint swelling.  Skin: Negative.   Neurological:  Negative for dizziness, weakness, numbness and headaches.  Hematological:  Negative for adenopathy. Does not bruise/bleed easily.  Psychiatric/Behavioral: Negative.      Objective:  BP (!) 154/100 (BP Location: Left Arm, Patient Position: Sitting, Cuff Size: Normal)   Pulse 78   Temp 97.9 F (36.6 C) (Oral)   Ht 5' 7.25" (1.708 m)   Wt 186 lb 9.6 oz (84.6 kg)   LMP 05/15/2018 (Exact Date)   SpO2 96%   BMI 29.01 kg/m   BP Readings from Last 3 Encounters:  08/02/23 (!) 154/100  04/02/23 128/88  03/19/23 110/84    Wt Readings from Last 3 Encounters:  08/02/23 186 lb 9.6 oz (84.6 kg)  04/02/23 189 lb (85.7 kg)  03/19/23 189 lb 9.6 oz (86 kg)    Physical Exam Vitals reviewed.  Constitutional:      Appearance: Normal appearance.  HENT:     Mouth/Throat:     Mouth: Mucous membranes are moist.  Eyes:     General: No scleral icterus.    Conjunctiva/sclera: Conjunctivae normal.  Cardiovascular:     Rate and Rhythm: Normal rate and regular rhythm.     Heart sounds: Normal heart sounds, S1 normal and S2 normal. No murmur heard.    No friction rub. No gallop.     Comments: EKG- NSR, 72 bpm LAE Anterior infarct pattern is old NO LVH Pulmonary:     Effort:  Pulmonary effort is normal.     Breath sounds: No stridor. No wheezing, rhonchi or rales.  Abdominal:     General: Abdomen is flat.     Palpations: There is no mass.     Tenderness: There is no abdominal tenderness. There is no guarding.     Hernia: No hernia is present.  Musculoskeletal:     Cervical back: Neck supple.     Right lower leg: No edema.     Left lower leg: No edema.  Skin:    General: Skin is warm and dry.     Coloration: Skin is not pale.  Neurological:     General: No focal deficit present.     Mental Status: She is alert. Mental status is at  baseline.  Psychiatric:        Behavior: Behavior normal.     Lab Results  Component Value Date   WBC 4.0 04/02/2023   HGB 12.1 04/02/2023   HCT 36.5 04/02/2023   PLT 331.0 04/02/2023   GLUCOSE 106 (H) 08/02/2023   CHOL 168 03/09/2023   TRIG 111 03/09/2023   HDL 42 03/09/2023   LDLCALC 106 (H) 03/09/2023   ALT 13 03/09/2023   AST 19 03/09/2023   NA 140 08/02/2023   K 3.0 (L) 08/02/2023   CL 98 08/02/2023   CREATININE 1.27 (H) 08/02/2023   BUN 13 08/02/2023   CO2 31 08/02/2023   TSH 2.43 04/02/2023   HGBA1C 5.6 05/04/2022    MM 3D SCREENING MAMMOGRAM BILATERAL BREAST Result Date: 07/24/2023 CLINICAL DATA:  Screening. EXAM: DIGITAL SCREENING BILATERAL MAMMOGRAM WITH TOMOSYNTHESIS AND CAD TECHNIQUE: Bilateral screening digital craniocaudal and mediolateral oblique mammograms were obtained. Bilateral screening digital breast tomosynthesis was performed. The images were evaluated with computer-aided detection. COMPARISON:  Previous exam(s). ACR Breast Density Category c: The breasts are heterogeneously dense, which may obscure small masses. FINDINGS: There are no findings suspicious for malignancy. IMPRESSION: No mammographic evidence of malignancy. A result letter of this screening mammogram will be mailed directly to the patient. RECOMMENDATION: Screening mammogram in one year. (Code:SM-B-01Y) BI-RADS CATEGORY  1: Negative. Electronically Signed   By: Norva Pavlov M.D.   On: 07/24/2023 13:43    Assessment & Plan:   Essential hypertension, benign- She has not achieved her BP goal and her K+ is low. Will start spironolactone and a CCB. -     Basic metabolic panel; Future -     Aldosterone + renin activity w/ ratio; Future -     Urinalysis, Routine w reflex microscopic; Future -     EKG 12-Lead -     US RENAL ARTERY DUPLEX COMPLETE; Future -     Spironolactone; Take 1 tablet (50 mg total) by mouth daily.  Dispense: 90 tablet; Refill: 0 -     amLODIPine Besylate; Take 1 tablet  (5 mg total) by mouth daily.  Dispense: 90 tablet; Refill: 0 -     AMB Referral [facility] Care Management  Chronic renal disease, stage 2, mildly decreased glomerular filtration rate (GFR) between 60-89 mL/min/1.73 square meter- Her renal function is stable. -     Basic metabolic panel; Future -     Urinalysis, Routine w reflex microscopic; Future -     US RENAL ARTERY DUPLEX COMPLETE; Future -     AMB Referral [facility] Care Management  Encounter for general adult medical examination with abnormal findings- Exam completed, labs reviewed, vaccines reviewed and updated, cancer screenings are UTD, pt ed material was given.  Need for shingles vaccine -     Varicella-zoster vaccine IM  Chronic hypokalemia -     Spironolactone; Take 1 tablet (50 mg total) by mouth daily.  Dispense: 90 tablet; Refill: 0 -     AMB Referral [facility] Care Management     Follow-up: Return in about 6 weeks (around 09/13/2023).  Sanda Linger, MD

## 2023-08-02 NOTE — Patient Instructions (Signed)
Hypertension, Adult High blood pressure (hypertension) is when the force of blood pumping through the arteries is too strong. The arteries are the blood vessels that carry blood from the heart throughout the body. Hypertension forces the heart to work harder to pump blood and may cause arteries to become narrow or stiff. Untreated or uncontrolled hypertension can lead to a heart attack, heart failure, a stroke, kidney disease, and other problems. A blood pressure reading consists of a higher number over a lower number. Ideally, your blood pressure should be below 120/80. The first ("top") number is called the systolic pressure. It is a measure of the pressure in your arteries as your heart beats. The second ("bottom") number is called the diastolic pressure. It is a measure of the pressure in your arteries as the heart relaxes. What are the causes? The exact cause of this condition is not known. There are some conditions that result in high blood pressure. What increases the risk? Certain factors may make you more likely to develop high blood pressure. Some of these risk factors are under your control, including: Smoking. Not getting enough exercise or physical activity. Being overweight. Having too much fat, sugar, calories, or salt (sodium) in your diet. Drinking too much alcohol. Other risk factors include: Having a personal history of heart disease, diabetes, high cholesterol, or kidney disease. Stress. Having a family history of high blood pressure and high cholesterol. Having obstructive sleep apnea. Age. The risk increases with age. What are the signs or symptoms? High blood pressure may not cause symptoms. Very high blood pressure (hypertensive crisis) may cause: Headache. Fast or irregular heartbeats (palpitations). Shortness of breath. Nosebleed. Nausea and vomiting. Vision changes. Severe chest pain, dizziness, and seizures. How is this diagnosed? This condition is diagnosed by  measuring your blood pressure while you are seated, with your arm resting on a flat surface, your legs uncrossed, and your feet flat on the floor. The cuff of the blood pressure monitor will be placed directly against the skin of your upper arm at the level of your heart. Blood pressure should be measured at least twice using the same arm. Certain conditions can cause a difference in blood pressure between your right and left arms. If you have a high blood pressure reading during one visit or you have normal blood pressure with other risk factors, you may be asked to: Return on a different day to have your blood pressure checked again. Monitor your blood pressure at home for 1 week or longer. If you are diagnosed with hypertension, you may have other blood or imaging tests to help your health care provider understand your overall risk for other conditions. How is this treated? This condition is treated by making healthy lifestyle changes, such as eating healthy foods, exercising more, and reducing your alcohol intake. You may be referred for counseling on a healthy diet and physical activity. Your health care provider may prescribe medicine if lifestyle changes are not enough to get your blood pressure under control and if: Your systolic blood pressure is above 130. Your diastolic blood pressure is above 80. Your personal target blood pressure may vary depending on your medical conditions, your age, and other factors. Follow these instructions at home: Eating and drinking  Eat a diet that is high in fiber and potassium, and low in sodium, added sugar, and fat. An example of this eating plan is called the DASH diet. DASH stands for Dietary Approaches to Stop Hypertension. To eat this way: Eat   plenty of fresh fruits and vegetables. Try to fill one half of your plate at each meal with fruits and vegetables. Eat whole grains, such as whole-wheat pasta, brown rice, or whole-grain bread. Fill about one  fourth of your plate with whole grains. Eat or drink low-fat dairy products, such as skim milk or low-fat yogurt. Avoid fatty cuts of meat, processed or cured meats, and poultry with skin. Fill about one fourth of your plate with lean proteins, such as fish, chicken without skin, beans, eggs, or tofu. Avoid pre-made and processed foods. These tend to be higher in sodium, added sugar, and fat. Reduce your daily sodium intake. Many people with hypertension should eat less than 1,500 mg of sodium a day. Do not drink alcohol if: Your health care provider tells you not to drink. You are pregnant, may be pregnant, or are planning to become pregnant. If you drink alcohol: Limit how much you have to: 0-1 drink a day for women. 0-2 drinks a day for men. Know how much alcohol is in your drink. In the [place] one drink equals one 12 oz bottle of beer (355 mL), one 5 oz glass of wine (148 mL), or one 1 oz glass of hard liquor (44 mL). Lifestyle  Work with your health care provider to maintain a healthy body weight or to lose weight. Ask what an ideal weight is for you. Get at least 30 minutes of exercise that causes your heart to beat faster (aerobic exercise) most days of the week. Activities may include walking, swimming, or biking. Include exercise to strengthen your muscles (resistance exercise), such as Pilates or lifting weights, as part of your weekly exercise routine. Try to do these types of exercises for 30 minutes at least 3 days a week. Do not use any products that contain nicotine or tobacco. These products include cigarettes, chewing tobacco, and vaping devices, such as e-cigarettes. If you need help quitting, ask your health care provider. Monitor your blood pressure at home as told by your health care provider. Keep all follow-up visits. This is important. Medicines Take over-the-counter and prescription medicines only as told by your health care provider. Follow directions carefully. Blood  pressure medicines must be taken as prescribed. Do not skip doses of blood pressure medicine. Doing this puts you at risk for problems and can make the medicine less effective. Ask your health care provider about side effects or reactions to medicines that you should watch for. Contact a health care provider if you: Think you are having a reaction to a medicine you are taking. Have headaches that keep coming back (recurring). Feel dizzy. Have swelling in your ankles. Have trouble with your vision. Get help right away if you: Develop a severe headache or confusion. Have unusual weakness or numbness. Feel faint. Have severe pain in your chest or abdomen. Vomit repeatedly. Have trouble breathing. These symptoms may be an emergency. Get help right away. Call 911. Do not wait to see if the symptoms will go away. Do not drive yourself to the hospital. Summary Hypertension is when the force of blood pumping through your arteries is too strong. If this condition is not controlled, it may put you at risk for serious complications. Your personal target blood pressure may vary depending on your medical conditions, your age, and other factors. For most people, a normal blood pressure is less than 120/80. Hypertension is treated with lifestyle changes, medicines, or a combination of both. Lifestyle changes include losing weight, eating a healthy,   low-sodium diet, exercising more, and limiting alcohol. This information is not intended to replace advice given to you by your health care provider. Make sure you discuss any questions you have with your health care provider. Document Revised: 06/14/2021 Document Reviewed: 06/14/2021 Elsevier Patient Education  2024 [employer]  

## 2023-08-02 NOTE — Telephone Encounter (Signed)
Spoke with [facility]. They will reach out to patient and schedule her for the new order.

## 2023-08-02 NOTE — Telephone Encounter (Signed)
[facility] called and wants to know if you just want a regular renal ultrasound or if you want the ultra sound renal artery duplex because of the diagnosis stating hypertension.  Please call Joyce Gross - [phone] - use option 1 then option 5

## 2023-08-03 ENCOUNTER — Encounter: Payer: Self-pay | Admitting: Internal Medicine

## 2023-08-03 ENCOUNTER — Telehealth: Payer: Self-pay

## 2023-08-03 ENCOUNTER — Other Ambulatory Visit: Payer: BC Managed Care – PPO

## 2023-08-03 NOTE — Progress Notes (Signed)
   Care Guide Note  08/03/2023 Name: ALURA SANTONE MRN: [id number] DOB: 1970-12-04  Referred by: Etta Grandchild, MD Reason for referral : Care Coordination (Outreach to schedule with Pharm d )   Tabitha Matthews is a 52 y.o. year old female who is a primary care patient of Etta Grandchild, MD. Holli Humbles was referred to the pharmacist for assistance related to HTN.    Successful contact was made with the patient to discuss pharmacy services including being ready for the pharmacist to call at least 5 minutes before the scheduled appointment time, to have medication bottles and any blood sugar or blood pressure readings ready for review. The patient agreed to meet with the pharmacist via with the pharmacist via telephone visit on (date/time).  08/08/2023  Penne Lash , RMA     [place]  [facility], [facility] Guide  Direct Dial: [phone]  Website: [email or web address]

## 2023-08-08 ENCOUNTER — Other Ambulatory Visit: Payer: BC Managed Care – PPO

## 2023-08-08 DIAGNOSIS — E876 Hypokalemia: Secondary | ICD-10-CM

## 2023-08-08 DIAGNOSIS — I10 Essential (primary) hypertension: Secondary | ICD-10-CM

## 2023-08-08 NOTE — Progress Notes (Signed)
08/08/2023 Name: Tabitha Matthews MRN: [id number] DOB: 1971-05-11  Chief Complaint  Patient presents with   Hypertension   Medication Management    Tabitha Matthews is a 52 y.o. year old female who presented for a telephone visit.   They were referred to the pharmacist by their PCP for assistance in managing hypertension.   Subjective:  Care Team: Primary Care Provider: Etta Grandchild, MD ; Next Scheduled Visit: not scheduled Endocrinologist Dr. Fransico Him; Next Scheduled Visit: 09/20/2023  Medication Access/Adherence  Current Pharmacy:  [id number] - [place], [place] - [address] [place] [address] Phone: [phone] Fax: [phone]   Patient reports affordability concerns with their medications: No  Patient reports access/transportation concerns to their pharmacy: No  Patient reports adherence concerns with their medications:  No     Hypertension:  Current medications: amlodipine 5 mg daily (restarted 12/12), spironolactone 50 mg daily (started 12/12), torsemide 20 mg daily Medications previously tried: amlodipine 10 mg, triamterene/hydrochlorothiazide, chlorthalidone  Has renal ultrasound this friday Family hx of CKD, HTN, CHF Went to endocrinologist in July for hyperaldosteronism. Labs were checked again and normal therefore no further workup was recommended. She goes back in 1 month for follow up with endo.  Patient does not have a validated, automated, upper arm home BP cuff. She has a wrist cuff. Current blood pressure readings: none recent  Pt notes she is feeling better since starting the amlodipine and spironolactone. When her BP was elevated previously, she felt jittery.  Pt notes BP was controlled when she was on amlodipine 10 mg and triamterene/hydrochlorothiazide however meds were changed when she reported swelling in her legs and since then BP has been uncontrolled. She notes swelling occurred after long car ride/traveling.  Is not currently an issue.  Objective:  BP Readings from Last 3 Encounters:  08/02/23 (!) 154/100  04/02/23 128/88  03/19/23 110/84     Lab Results  Component Value Date   HGBA1C 5.6 05/04/2022    Lab Results  Component Value Date   CREATININE 1.27 (H) 08/02/2023   BUN 13 08/02/2023   NA 140 08/02/2023   K 3.0 (L) 08/02/2023   CL 98 08/02/2023   CO2 31 08/02/2023    Lab Results  Component Value Date   CHOL 168 03/09/2023   HDL 42 03/09/2023   LDLCALC 106 (H) 03/09/2023   TRIG 111 03/09/2023   CHOLHDL 4.0 03/09/2023    Medications Reviewed Today     Reviewed by Bonita Quin, RPH (Pharmacist) on 08/08/23 at 0934  Med List Status: <None>   Medication Order Taking? Sig Documenting Provider Last Dose Status Informant  amLODipine (NORVASC) 5 MG tablet [id number] Yes Take 1 tablet (5 mg total) by mouth daily. Etta Grandchild, MD Taking Active   escitalopram (LEXAPRO) 10 MG tablet [id number] Yes Take 10 mg by mouth daily. [provider] Taking Active   rosuvastatin (CRESTOR) 5 MG tablet [id number] Yes Take 1 tablet (5 mg total) by mouth daily. Roma Kayser, MD Taking Active   spironolactone (ALDACTONE) 50 MG tablet [id number] Yes Take 1 tablet (50 mg total) by mouth daily. Etta Grandchild, MD Taking Active   torsemide [facility]) 20 MG tablet [id number] Yes Take 1 tablet (20 mg total) by mouth daily. Etta Grandchild, MD Taking Active               Assessment/Plan:   Hypertension: - Currently uncontrolled. Bpm  goal <130/80 - Reviewed long term cardiovascular  and renal outcomes of uncontrolled blood pressure - Reviewed appropriate blood pressure monitoring technique and reviewed goal blood pressure. Recommended to check home blood pressure and heart rate daily and keep a log. Recommended to get an arm cuff. - Recommend to continue current regimen. If BP are better controlled, recommend trying without torsemide.  - Need updated BMP to follow up  potassium and Scr after initiation of spironolactone   Follow Up Plan: 12/27 phone  Arbutus Leas, PharmD, BCPS, CPP Clinical Pharmacist Practitioner [facility] [facility] [phone]

## 2023-08-08 NOTE — Patient Instructions (Signed)
It was a pleasure speaking with you today!  Continue your current blood pressure regimen. I recommend getting an arm blood pressure cuff to check your blood pressures at home at least once daily for the next week. I will call you back on 12/27 to go over the readings and decide on the best plan for your medications.  Feel free to call with any questions or concerns!  Arbutus Leas, PharmD, BCPS [facility] Clinical Pharmacist [facility] [phone]

## 2023-08-08 NOTE — Addendum Note (Signed)
Addended by: Candy Sledge R on: 08/08/2023 01:07 PM   Modules accepted: Level of Service

## 2023-08-10 ENCOUNTER — Ambulatory Visit
Admission: RE | Admit: 2023-08-10 | Discharge: 2023-08-10 | Disposition: A | Discharge status: Home / Self Care | Payer: BC Managed Care – PPO | Source: Ambulatory Visit | Attending: Internal Medicine | Admitting: Internal Medicine

## 2023-08-10 DIAGNOSIS — N182 Chronic kidney disease, stage 2 (mild): Secondary | ICD-10-CM

## 2023-08-10 DIAGNOSIS — I10 Essential (primary) hypertension: Secondary | ICD-10-CM

## 2023-08-10 LAB — ALDOSTERONE + RENIN ACTIVITY W/ RATIO
ALDO / PRA Ratio: 52.9 {ratio} — ABNORMAL HIGH (ref 0.9–28.9)
Aldosterone: 9 ng/dL
Renin Activity: 0.17 ng/mL/h — ABNORMAL LOW (ref 0.25–5.82)

## 2023-08-11 ENCOUNTER — Encounter: Payer: Self-pay | Admitting: Internal Medicine

## 2023-08-14 ENCOUNTER — Other Ambulatory Visit: Payer: Self-pay | Admitting: Internal Medicine

## 2023-08-14 DIAGNOSIS — N182 Chronic kidney disease, stage 2 (mild): Secondary | ICD-10-CM

## 2023-08-17 ENCOUNTER — Other Ambulatory Visit (INDEPENDENT_AMBULATORY_CARE_PROVIDER_SITE_OTHER): Payer: BC Managed Care – PPO | Admitting: Pharmacist

## 2023-08-17 VITALS — BP 117/85

## 2023-08-17 DIAGNOSIS — I10 Essential (primary) hypertension: Secondary | ICD-10-CM

## 2023-08-17 NOTE — Progress Notes (Signed)
08/17/2023 Name: Tabitha Matthews MRN: [id number] DOB: 21-Jul-1971  Chief Complaint  Patient presents with   Hypertension   Medication Management    Tabitha Matthews is a 52 y.o. year old female who presented for a telephone visit.   They were referred to the pharmacist by their PCP for assistance in managing hypertension.    Subjective:  Care Team: Primary Care Provider: Etta Grandchild, MD ; Next Scheduled Visit: not scheduled Endocrinologist Dr. Fransico Him; Next Scheduled Visit: 09/20/2023  Medication Access/Adherence  Current Pharmacy:  [id number] - [place], [place] - [address] [place] [address] Phone: [phone] Fax: [phone]   Patient reports affordability concerns with their medications: No  Patient reports access/transportation concerns to their pharmacy: No  Patient reports adherence concerns with their medications:  No     Hypertension:  Current medications: amlodipine 5 mg daily (restarted 12/12), spironolactone 50 mg daily (started 12/12), torsemide 20 mg daily Medications previously tried: amlodipine 10 mg, triamterene/hydrochlorothiazide, chlorthalidone  Has renal ultrasound this friday Family hx of CKD, HTN, CHF Went to endocrinologist in July for hyperaldosteronism. Labs were checked again and normal therefore no further workup was recommended. She goes back in 1 month for follow up with endo.  Patient does not have a validated, automated, upper arm home BP cuff. She has a wrist cuff. Current blood pressure readings:  Lowest: 117/85 Highest: 130/88  Pt denies dizziness, weakness, or swelling  Pt notes BP was controlled when she was on amlodipine 10 mg and triamterene/hydrochlorothiazide however meds were changed when she reported swelling in her legs and since then BP has been uncontrolled. She notes swelling occurred after long car ride/traveling. Is not currently an issue.  Objective:  BP Readings from Last 3  Encounters:  08/17/23 117/85  08/02/23 (!) 154/100  04/02/23 128/88     Lab Results  Component Value Date   HGBA1C 5.6 05/04/2022    Lab Results  Component Value Date   CREATININE 1.27 (H) 08/02/2023   BUN 13 08/02/2023   NA 140 08/02/2023   K 3.0 (L) 08/02/2023   CL 98 08/02/2023   CO2 31 08/02/2023    Lab Results  Component Value Date   CHOL 168 03/09/2023   HDL 42 03/09/2023   LDLCALC 106 (H) 03/09/2023   TRIG 111 03/09/2023   CHOLHDL 4.0 03/09/2023    Medications Reviewed Today     Reviewed by Bonita Quin, RPH (Pharmacist) on 08/17/23 at 1104  Med List Status: <None>   Medication Order Taking? Sig Documenting Provider Last Dose Status Informant  amLODipine (NORVASC) 5 MG tablet [id number] Yes Take 1 tablet (5 mg total) by mouth daily. Etta Grandchild, MD Taking Active   escitalopram (LEXAPRO) 10 MG tablet [id number] Yes Take 10 mg by mouth daily. [provider] Taking Active   rosuvastatin (CRESTOR) 5 MG tablet [id number] Yes Take 1 tablet (5 mg total) by mouth daily. Roma Kayser, MD Taking Active   spironolactone (ALDACTONE) 50 MG tablet [id number] Yes Take 1 tablet (50 mg total) by mouth daily. Etta Grandchild, MD Taking Active   torsemide [facility]) 20 MG tablet [id number] Yes Take 1 tablet (20 mg total) by mouth daily. Etta Grandchild, MD Taking Active               Assessment/Plan:   Hypertension: - Currently controlled. BP  goal <130/80. BP much improved s/p amlodipine and spironolactone initiation. - Renal u/s negative for renal artery stenosis,  waiting for renal referral to be scheduled - Reviewed long term cardiovascular and renal outcomes of uncontrolled blood pressure - Reviewed appropriate blood pressure monitoring technique and reviewed goal blood pressure. Recommended to check home blood pressure and heart rate daily and keep a log. Recommended to get an arm cuff. - Recommend to stop torsemide as likely does not need two  diuretics and torsemide does not lower BP as much as spironolactone. Will f/u in 1 week for BP readings. - Will set up BMP check at next f/u   Follow Up Plan: 1/2 phone  Arbutus Leas, PharmD, BCPS, CPP Clinical Pharmacist Practitioner [place] [facility] at [facility] [phone]

## 2023-08-17 NOTE — Patient Instructions (Signed)
It was a pleasure speaking with you today!  Stop torsemide and continue amlodipine and spironolactone. Keep checking your blood pressure and I will follow up in 1 week to review the readings with you.  Feel free to call with any questions or concerns!  Arbutus Leas, PharmD, BCPS  [facility] Clinical Pharmacist [facility] [phone]

## 2023-08-23 ENCOUNTER — Other Ambulatory Visit: Payer: BC Managed Care – PPO

## 2023-08-23 VITALS — BP 115/86

## 2023-08-23 DIAGNOSIS — N182 Chronic kidney disease, stage 2 (mild): Secondary | ICD-10-CM

## 2023-08-23 DIAGNOSIS — I10 Essential (primary) hypertension: Secondary | ICD-10-CM

## 2023-08-23 NOTE — Patient Instructions (Signed)
 It was a pleasure speaking with you today!  Continue amlodipine  and spironolactone  for blood pressure. Stop by the office next week to get walk-in blood work done. I will call with the results.  Feel free to call with any questions or concerns!  Darrelyn Drum, PharmD, BCPS [facility] Clinical Pharmacist [facility] [phone]

## 2023-08-23 NOTE — Progress Notes (Signed)
 08/23/2023 Name: Tabitha Matthews MRN: [id number] DOB: 06-14-1971  Chief Complaint  Patient presents with   Hypertension   Medication Management    Tabitha Matthews is a 53 y.o. year old female who presented for a telephone visit.   They were referred to the pharmacist by their PCP for assistance in managing hypertension.    Subjective:  Care Team: Primary Care Provider: Joshua Debby CROME, MD ; Next Scheduled Visit: not scheduled Endocrinologist Dr. Lenis; Next Scheduled Visit: 09/20/2023  Medication Access/Adherence  Current Pharmacy:  [id number] - [place], [place] - [address] [place] [address] Phone: [phone] Fax: [phone]   Patient reports affordability concerns with their medications: No  Patient reports access/transportation concerns to their pharmacy: No  Patient reports adherence concerns with their medications:  No     Hypertension:  Current medications: amlodipine  5 mg daily (restarted 12/12), spironolactone  50 mg daily (started 12/12) Medications previously tried: amlodipine  10 mg, triamterene /hydrochlorothiazide, chlorthalidone , torsemide  stopped (12/27 due to pt on spironolactone  and BP better controlled))  Has renal ultrasound this friday Family hx of CKD, HTN, CHF Went to endocrinologist in July for hyperaldosteronism. Labs were checked again and normal therefore no further workup was recommended. She goes back in 1 month for follow up with endo.  Patient does have a validated, automated, upper arm home BP cuff.  Current blood pressure readings:  Prior to d/c torsemide : Lowest: 117/85 Highest: 130/88  Since d/c torsemide : 125/88 123/86 124/87 142/85 133/87 127/93 115/86  Pt denies dizziness, weakness, or swelling since d/c torsemide   Pt notes BP was controlled when she was on amlodipine  10 mg and triamterene /hydrochlorothiazide however meds were changed when she reported swelling in her legs and since  then BP has been uncontrolled. She notes swelling occurred after long car ride/traveling. Is not currently an issue.  Objective:  BP Readings from Last 3 Encounters:  08/17/23 117/85  08/02/23 (!) 154/100  04/02/23 128/88     Lab Results  Component Value Date   HGBA1C 5.6 05/04/2022    Lab Results  Component Value Date   CREATININE 1.27 (H) 08/02/2023   BUN 13 08/02/2023   NA 140 08/02/2023   K 3.0 (L) 08/02/2023   CL 98 08/02/2023   CO2 31 08/02/2023    Lab Results  Component Value Date   CHOL 168 03/09/2023   HDL 42 03/09/2023   LDLCALC 106 (H) 03/09/2023   TRIG 111 03/09/2023   CHOLHDL 4.0 03/09/2023    Medications Reviewed Today     Reviewed by Merceda Lela SAUNDERS, RPH (Pharmacist) on 08/23/23 at [phone]  Med List Status: <None>   Medication Order Taking? Sig Documenting Provider Last Dose Status Informant  amLODipine  (NORVASC ) 5 MG tablet [id number] Yes Take 1 tablet (5 mg total) by mouth daily. Joshua Debby CROME, MD Taking Active   escitalopram (LEXAPRO) 10 MG tablet [id number]  Take 10 mg by mouth daily. [provider]  Active   rosuvastatin  (CRESTOR ) 5 MG tablet [id number]  Take 1 tablet (5 mg total) by mouth daily. Nida, Gebreselassie W, MD  Active   spironolactone  (ALDACTONE ) 50 MG tablet [id number] Yes Take 1 tablet (50 mg total) by mouth daily. Joshua Debby CROME, MD Taking Active               Assessment/Plan:   Hypertension: - Currently  borderline controlled (systolic is well controlled, diastolic tends to be elevated in the 80s). BP  goal <130/80. BP much improved s/p amlodipine   and spironolactone  initiation. No significant change after stopping torsemide . - Renal u/s negative for renal artery stenosis, waiting for renal referral to be scheduled - Reviewed appropriate blood pressure monitoring technique and reviewed goal blood pressure. Recommended to check home blood pressure and heart rate daily and keep a log.  - Order BMP for walk-in lab  next week to check renal function and potassium s/p diuretic changes   Follow Up Plan: F/u labs, PCP f/u scheduled for 1/23  Darrelyn Drum, PharmD, BCPS, CPP Clinical Pharmacist Practitioner [facility] [phone]

## 2023-08-28 ENCOUNTER — Other Ambulatory Visit (INDEPENDENT_AMBULATORY_CARE_PROVIDER_SITE_OTHER): Payer: Self-pay

## 2023-08-28 DIAGNOSIS — I10 Essential (primary) hypertension: Secondary | ICD-10-CM

## 2023-08-28 DIAGNOSIS — N182 Chronic kidney disease, stage 2 (mild): Secondary | ICD-10-CM

## 2023-08-28 LAB — BASIC METABOLIC PANEL
BUN: 9 mg/dL (ref 6–23)
CO2: 27 meq/L (ref 19–32)
Calcium: 10.2 mg/dL (ref 8.4–10.5)
Chloride: 102 meq/L (ref 96–112)
Creatinine, Ser: 1.1 mg/dL (ref 0.40–1.20)
GFR: 57.48 mL/min — ABNORMAL LOW (ref >60.00)
Glucose, Bld: 85 mg/dL (ref 70–99)
Potassium: 4 meq/L (ref 3.5–5.1)
Sodium: 136 meq/L (ref 135–145)

## 2023-08-29 ENCOUNTER — Other Ambulatory Visit: Payer: Self-pay | Admitting: Pharmacist

## 2023-08-29 DIAGNOSIS — I10 Essential (primary) hypertension: Secondary | ICD-10-CM

## 2023-08-29 NOTE — Progress Notes (Signed)
   08/29/2023 Name: Tabitha Matthews MRN: [id number] DOB: 1971-02-15  Chief Complaint  Patient presents with   Hypertension   Medication Management    Tabitha Matthews is a 53 y.o. year old female who presented for a telephone visit.   They were referred to the pharmacist by their PCP for assistance in managing hypertension.    Subjective:  Care Team: Primary Care Provider: Joshua Debby CROME, MD ; Next Scheduled Visit: not scheduled Endocrinologist Dr. Lenis; Next Scheduled Visit: 09/20/2023  Medication Access/Adherence  Current Pharmacy:  [id number] - [place] - [address] [place] [address] Phone: [phone] Fax: [phone]   Patient reports affordability concerns with their medications: No  Patient reports access/transportation concerns to their pharmacy: No  Patient reports adherence concerns with their medications:  No     Hypertension:  Current medications: amlodipine  5 mg daily (restarted 12/12), spironolactone  50 mg daily (started 12/12) Medications previously tried: amlodipine  10 mg, triamterene /hydrochlorothiazide, chlorthalidone , torsemide  stopped (12/27 due to pt on spironolactone  and BP better controlled))  Has renal ultrasound this friday Family hx of CKD, HTN, CHF Went to endocrinologist in July for hyperaldosteronism. Labs were checked again and normal therefore no further workup was recommended. She goes back in 1 month for follow up with endo.  Patient does have a validated, automated, upper arm home BP cuff.  Current blood pressure readings:  Prior to d/c torsemide : Lowest: 117/85 Highest: 130/88  Since d/c torsemide : 125/88 123/86 124/87 142/85 133/87 127/93 115/86  Pt denies dizziness, weakness, or swelling since d/c torsemide   Pt notes BP was controlled when she was on amlodipine  10 mg and triamterene /hydrochlorothiazide however meds were changed when she reported swelling in her legs and since  then BP has been uncontrolled. She notes swelling occurred after long car ride/traveling. Is not currently an issue.  Objective:  BP Readings from Last 3 Encounters:  08/23/23 115/86  08/17/23 117/85  08/02/23 (!) 154/100     Lab Results  Component Value Date   HGBA1C 5.6 05/04/2022    Lab Results  Component Value Date   CREATININE 1.10 08/28/2023   BUN 9 08/28/2023   NA 136 08/28/2023   K 4.0 08/28/2023   CL 102 08/28/2023   CO2 27 08/28/2023    Lab Results  Component Value Date   CHOL 168 03/09/2023   HDL 42 03/09/2023   LDLCALC 106 (H) 03/09/2023   TRIG 111 03/09/2023   CHOLHDL 4.0 03/09/2023    Medications Reviewed Today   Medications were not reviewed in this encounter       Assessment/Plan:   Hypertension: - Currently  borderline controlled (systolic is well controlled, diastolic tends to be elevated in the 80s). BP  goal <130/80. BP much improved s/p amlodipine  and spironolactone  initiation. No significant change after stopping torsemide . - Renal u/s negative for renal artery stenosis, waiting for renal referral to be scheduled - Reviewed appropriate blood pressure monitoring technique and reviewed goal blood pressure. Recommended to check home blood pressure and heart rate daily and keep a log.  - 1/7 BMP w/ potassium back WNL and renal function improved - Provided patient with renal referral office phone number to call and schedule   Follow Up: PCP f/u scheduled for 1/23  Darrelyn Drum, PharmD, BCPS, CPP Clinical Pharmacist Practitioner [place] [facility] [phone]

## 2023-08-29 NOTE — Patient Instructions (Signed)
 It was a pleasure speaking with you today!    Feel free to call with any questions or concerns!  Arbutus Leas, PharmD, BCPS [facility] Clinical Pharmacist [facility] [phone]

## 2023-08-31 ENCOUNTER — Encounter: Payer: Self-pay | Admitting: Pharmacist

## 2023-09-04 ENCOUNTER — Other Ambulatory Visit: Payer: Self-pay | Admitting: Internal Medicine

## 2023-09-13 ENCOUNTER — Encounter: Payer: Self-pay | Admitting: Internal Medicine

## 2023-09-13 ENCOUNTER — Ambulatory Visit (INDEPENDENT_AMBULATORY_CARE_PROVIDER_SITE_OTHER): Payer: 59 | Admitting: Internal Medicine

## 2023-09-13 VITALS — BP 132/82 | HR 93 | Temp 98.2°F | Resp 16 | Ht 67.25 in | Wt 186.2 lb

## 2023-09-13 DIAGNOSIS — I10 Essential (primary) hypertension: Secondary | ICD-10-CM | POA: Diagnosis not present

## 2023-09-13 DIAGNOSIS — N182 Chronic kidney disease, stage 2 (mild): Secondary | ICD-10-CM

## 2023-09-13 NOTE — Patient Instructions (Signed)
Chronic Kidney Disease in Adults: What to Know Chronic kidney disease (CKD) is when lasting damage happens to the kidneys slowly over time. The kidneys are two organs that do many important things in the body. These include: Taking waste and extra fluid out of the blood to make pee (urine). Making hormones. Keeping the right amount of fluids and chemicals in the body. A small amount of kidney damage may not cause problems. You must take steps to help keep the kidney damage from getting worse. A lot of damage may cause kidney failure. Kidney failure means the kidneys can no longer work right. What are the causes? Diabetes. High blood pressure. Diseases that affect the heart and blood vessels. Other kidney diseases. Diseases that affect the body's defense system (immune system). A problem with the flow of pee. This may be caused by: Kidney stones. Cancer. An enlarged prostate, in males. A kidney infection or urinary tract infection (UTI) that keeps coming back. What increases the risk? Getting older. The chances of having CKD increase with age. A family history of kidney disease or kidney failure. Having a disease caused by genes. Taking medicines that can harm the kidneys. Being near or having contact with harmful substances. Being very overweight. Using tobacco now or in the past. What are the signs or symptoms? Common symptoms of CKD include: Feeling very tired and having less energy. Swelling of the face, legs, ankles, or feet. Throwing up or feeling like you may throw up. Not wanting to eat as much as normal. Being confused or not able to focus. Twitches and cramps in the leg muscles or other muscles. Dry, itchy skin. Other symptoms may include: Shortness of breath. Trouble sleeping. Making less pee, or making more pee, especially at night. A taste of metal in your mouth. You may also become anemic. Anemia means there's not enough red blood cells in your blood. You may get  symptoms slowly. You may not notice them until the kidney damage gets very bad. How is this diagnosed? CKD may be diagnosed based on: Tests on your blood or pee. Imaging tests, like an ultrasound or a CT scan. A kidney biopsy. For this test, a sample of kidney tissue is removed to be looked at under a microscope. These tests will help to find out how serious the CKD is. How is this treated? Often, there's no cure for CKD. Treatment can help with symptoms and help keep the disease from getting worse. Treatment may include: Treating other problems that are causing your CKD or making it worse. Diet changes. You may need to: Avoid alcohol. Avoid foods that are high in salt, potassium, phosphorous, and protein. Taking medicines for symptoms and to help control other conditions. Dialysis. This treatment gets harmful waste out of your body. It may be needed if you have kidney failure. Follow these instructions at home: Medicines Take your medicines only as told. The amount of some medicines you take may need to be changed. Do not take any new medicines, vitamins, or supplements unless your health care provider says it's okay. These may make kidney damage worse. Lifestyle Do not smoke, vape, or use nicotine or tobacco. If you drink alcohol: Limit how much you have to: 0-1 drink a day if you're female. 0-2 drinks a day if you're female. Know how much alcohol is in your drink. In the [place] one drink is one 12 oz bottle of beer (355 mL), one 5 oz glass of wine (148 mL), or one 1 oz  glass of hard liquor (44 mL). Stay at a healthy weight. If you need help, ask your provider. General instructions  Eat and drink as told. Track your blood pressure at home. Tell your provider about any changes. If you have diabetes, track your blood sugar as told. Exercise at least 30 minutes a day, 5 days a week. Keep your shots (vaccinations) up to date. Keep all follow-up visits. Your provider may need to change  your treatments over time. Where to find support American Kidney Fund: [place].com.au Kidney School: kidneyschool.org American Association of Kidney Patients: https://www.miller-montoya.com/ Where to find more information National Kidney Foundation: kidney.org Centers for Disease Control and Prevention. To learn more: Go to DiningCalendar.de. Click "Search". Type "chronic kidney disease" in the search box. Contact a health care provider if: You have new symptoms. You get symptoms of end-stage kidney disease. These include: Headaches. Numbness in your hands or feet. Leg cramps. Easy bruising. Get help right away if: You have a fever. You make less pee than usual. You have pain or bleeding when you pee or poop. You have chest pain. You have shortness of breath. These symptoms may be an emergency. Call 911 right away. Do not wait to see if the symptoms will go away. Do not drive yourself to the hospital. This information is not intended to replace advice given to you by your health care provider. Make sure you discuss any questions you have with your health care provider. Document Revised: 06/19/2023 Document Reviewed: 02/09/2023 Elsevier Patient Education  2024 [employer].

## 2023-09-13 NOTE — Progress Notes (Signed)
Subjective:  Patient ID: Tabitha Matthews, female    DOB: 14-Oct-1970  Age: 53 y.o. MRN: [id number]  CC: Hypertension and Hyperlipidemia   HPI Lititia S Sheridan presents for f/up ----  Discussed the use of AI scribe software for clinical note transcription with the patient, who gave verbal consent to proceed.  History of Present Illness   The patient, with a history of hypertension and kidney disease, reports no recent headaches, blurred vision, chest pain, or shortness of breath. She denies experiencing any side effects from her current medications. She recently discontinued a diuretic medication, spironolactone, under the guidance of a pharmacist, but continues to take a 50mg  medication, likely another antihypertensive.  Despite recent weather changes limiting her outdoor activities, the patient has been trying to maintain an active lifestyle, with no associated chest pain, shortness of breath, cold sweats, or edema. She has completed a shingles vaccine series but does not receive flu vaccines.  The patient had a recent referral for her kidney disease, which required coordination of medical record transfer. She expresses satisfaction with her recent blood pressure readings, which have been consistently around 126-127, and today measured at 132/80.       Outpatient Medications Prior to Visit  Medication Sig Dispense Refill   amLODipine (NORVASC) 5 MG tablet Take 1 tablet (5 mg total) by mouth daily. 90 tablet 0   escitalopram (LEXAPRO) 10 MG tablet Take 10 mg by mouth daily.     rosuvastatin (CRESTOR) 5 MG tablet Take 1 tablet (5 mg total) by mouth daily. 90 tablet 0   spironolactone (ALDACTONE) 50 MG tablet Take 1 tablet (50 mg total) by mouth daily. 90 tablet 0   No facility-administered medications prior to visit.    ROS Review of Systems  Constitutional: Negative.  Negative for diaphoresis and fatigue.  HENT: Negative.    Eyes: Negative.   Respiratory: Negative.  Negative for  cough, chest tightness, shortness of breath and wheezing.   Cardiovascular:  Negative for chest pain, palpitations and leg swelling.  Gastrointestinal:  Negative for abdominal pain, constipation, diarrhea, nausea and vomiting.  Endocrine: Negative.   Genitourinary: Negative.  Negative for difficulty urinating.  Musculoskeletal: Negative.   Skin: Negative.   Neurological:  Negative for dizziness, weakness, numbness and headaches.  Hematological:  Negative for adenopathy. Does not bruise/bleed easily.  Psychiatric/Behavioral: Negative.      Objective:  BP 132/82 (BP Location: Left Arm, Patient Position: Sitting, Cuff Size: Normal)   Pulse 93   Temp 98.2 F (36.8 C) (Oral)   Resp 16   Ht 5' 7.25" (1.708 m)   Wt 186 lb 3.2 oz (84.5 kg)   LMP 05/15/2018 (Exact Date)   SpO2 97%   BMI 28.95 kg/m   BP Readings from Last 3 Encounters:  09/13/23 132/82  08/23/23 115/86  08/17/23 117/85    Wt Readings from Last 3 Encounters:  09/13/23 186 lb 3.2 oz (84.5 kg)  08/02/23 186 lb 9.6 oz (84.6 kg)  04/02/23 189 lb (85.7 kg)    Physical Exam Vitals reviewed.  Constitutional:      Appearance: Normal appearance.  HENT:     Mouth/Throat:     Mouth: Mucous membranes are moist.  Eyes:     General: No scleral icterus.    Conjunctiva/sclera: Conjunctivae normal.  Cardiovascular:     Rate and Rhythm: Normal rate and regular rhythm.     Heart sounds: No murmur heard.    No gallop.  Pulmonary:  Effort: Pulmonary effort is normal.     Breath sounds: No stridor. No wheezing, rhonchi or rales.  Abdominal:     General: Abdomen is flat.     Palpations: There is no mass.     Tenderness: There is no abdominal tenderness. There is no guarding.     Hernia: No hernia is present.  Musculoskeletal:        General: Normal range of motion.     Cervical back: Neck supple.     Right lower leg: No edema.     Left lower leg: No edema.  Lymphadenopathy:     Cervical: No cervical adenopathy.   Skin:    General: Skin is warm and dry.  Neurological:     General: No focal deficit present.     Mental Status: She is alert. Mental status is at baseline.  Psychiatric:        Mood and Affect: Mood normal.        Behavior: Behavior normal.     Lab Results  Component Value Date   WBC 4.0 04/02/2023   HGB 12.1 04/02/2023   HCT 36.5 04/02/2023   PLT 331.0 04/02/2023   GLUCOSE 85 08/28/2023   CHOL 168 03/09/2023   TRIG 111 03/09/2023   HDL 42 03/09/2023   LDLCALC 106 (H) 03/09/2023   ALT 13 03/09/2023   AST 19 03/09/2023   NA 136 08/28/2023   K 4.0 08/28/2023   CL 102 08/28/2023   CREATININE 1.10 08/28/2023   BUN 9 08/28/2023   CO2 27 08/28/2023   TSH 2.43 04/02/2023   HGBA1C 5.6 05/04/2022    US Renal Artery Stenosis Result Date: 08/11/2023 CLINICAL DATA:  Hypertension EXAM: RENAL/URINARY TRACT ULTRASOUND RENAL DUPLEX DOPPLER ULTRASOUND COMPARISON:  None Available. FINDINGS: Right Kidney: Length: 11.5 cm. Echogenic renal parenchyma with increased conspicuity of the corticomedullary interface. No mass, hydronephrosis or nephrolithiasis. Left Kidney: Length: 11.3 cm. Echogenic renal parenchyma with increased conspicuity of the corticomedullary interface. No mass, hydronephrosis or nephrolithiasis. Bladder:  Within normal limits RENAL DUPLEX ULTRASOUND Right Renal Artery Velocities: Origin:  97 cm/sec Mid:  95 cm/sec Hilum:  105 cm/sec Interlobar:  77 cm/sec Arcuate:  45 cm/sec Left Renal Artery Velocities: Origin:  88 cm/sec Mid:  88 cm/sec Hilum:  103 cm/sec Interlobar:  68 cm/sec Arcuate:  39 cm/sec Aortic Velocity:  96 cm/sec Right Renal-Aortic Ratios: Origin: 1.0 Mid:  1.0 Hilum: 1.1 Interlobar: 0.8 Arcuate: 0.5 Left Renal-Aortic Ratios: Origin: 0.9 Mid: 0.9 Hilum: 1.1 Interlobar: 0.7 Arcuate: 0.4 IMPRESSION: 1. No evidence of hemodynamically significant renal artery stenosis. 2. Echogenic kidneys bilaterally suggests underlying medical renal disease. Electronically Signed    By: Malachy Moan M.D.   On: 08/11/2023 06:30    Assessment & Plan:   Essential hypertension, benign- Her blood pressure is adequately well-controlled.  Chronic renal disease, stage 2, mildly decreased glomerular filtration rate (GFR) between 60-89 mL/min/1.73 square meter- Will avoid nephrotoxic agents      Follow-up: Return in about 6 months (around 03/12/2024).  Sanda Linger, MD

## 2023-09-17 ENCOUNTER — Other Ambulatory Visit: Payer: Self-pay | Admitting: Internal Medicine

## 2023-09-17 DIAGNOSIS — N1831 Chronic kidney disease, stage 3a: Secondary | ICD-10-CM

## 2023-09-17 DIAGNOSIS — I10 Essential (primary) hypertension: Secondary | ICD-10-CM

## 2023-09-18 LAB — COMPREHENSIVE METABOLIC PANEL
ALT: 12 [IU]/L (ref 0–32)
AST: 15 [IU]/L (ref 0–40)
Albumin: 4.6 g/dL (ref 3.8–4.9)
Alkaline Phosphatase: 96 [IU]/L (ref 44–121)
BUN/Creatinine Ratio: 10 (ref 9–23)
BUN: 12 mg/dL (ref 6–24)
Bilirubin Total: 1.1 mg/dL (ref 0.0–1.2)
CO2: 22 mmol/L (ref 20–29)
Calcium: 10.5 mg/dL — ABNORMAL HIGH (ref 8.7–10.2)
Chloride: 102 mmol/L (ref 96–106)
Creatinine, Ser: 1.24 mg/dL — ABNORMAL HIGH (ref 0.57–1.00)
Globulin, Total: 3.2 g/dL (ref 1.5–4.5)
Glucose: 99 mg/dL (ref 70–99)
Potassium: 4.7 mmol/L (ref 3.5–5.2)
Sodium: 139 mmol/L (ref 134–144)
Total Protein: 7.8 g/dL (ref 6.0–8.5)
eGFR: 52 mL/min/{1.73_m2} — ABNORMAL LOW (ref >59)

## 2023-09-18 LAB — LIPID PANEL
Chol/HDL Ratio: 4.3 {ratio} (ref 0.0–4.4)
Cholesterol, Total: 163 mg/dL (ref 100–199)
HDL: 38 mg/dL — ABNORMAL LOW (ref >39)
LDL Chol Calc (NIH): 104 mg/dL — ABNORMAL HIGH (ref 0–99)
Triglycerides: 118 mg/dL (ref 0–149)
VLDL Cholesterol Cal: 21 mg/dL (ref 5–40)

## 2023-09-20 ENCOUNTER — Ambulatory Visit: Payer: 59 | Admitting: "Endocrinology

## 2023-09-20 ENCOUNTER — Encounter: Payer: Self-pay | Admitting: "Endocrinology

## 2023-09-20 VITALS — BP 110/82 | HR 76 | Ht 67.25 in | Wt 181.4 lb

## 2023-09-20 DIAGNOSIS — E782 Mixed hyperlipidemia: Secondary | ICD-10-CM

## 2023-09-20 DIAGNOSIS — I10 Essential (primary) hypertension: Secondary | ICD-10-CM | POA: Diagnosis not present

## 2023-09-20 DIAGNOSIS — E349 Endocrine disorder, unspecified: Secondary | ICD-10-CM | POA: Diagnosis not present

## 2023-09-20 NOTE — Progress Notes (Signed)
09/20/2023, 1:11 PM  Endocrinology follow-up note   Subjective:    Patient ID: Tabitha Matthews, female    DOB: [birth year], PCP Etta Grandchild, MD   Past Medical History:  Diagnosis Date   Anemia    Hyperlipidemia    Hypertension 05/17/2018   IDA (iron deficiency anemia) 05/17/2018   Past Surgical History:  Procedure Laterality Date   CESAREAN SECTION     ROBOTIC ASSISTED LAPAROSCOPIC HYSTERECTOMY AND SALPINGECTOMY Bilateral 05/23/2018   Procedure: XI ROBOTIC ASSISTED LAPAROSCOPIC HYSTERECTOMY AND SALPINGECTOMY;  Surgeon: Maxie Better, MD;  Location: [facility];  Service: Gynecology;  Laterality: Bilateral;   Social History   Socioeconomic History   Marital status: Married    Spouse name: Not on file   Number of children: Not on file   Years of education: Not on file   Highest education level: Doctorate  Occupational History   Not on file  Tobacco Use   Smoking status: Never    Passive exposure: Never   Smokeless tobacco: Never  Vaping Use   Vaping status: Never Used  Substance and Sexual Activity   Alcohol use: No   Drug use: No   Sexual activity: Yes    Partners: Male    Birth control/protection: I.U.D.  Other Topics Concern   Not on file  Social History Narrative   Not on file   Social Drivers of Health   Financial Resource Strain: Low Risk  (08/02/2023)   Overall Financial Resource Strain (CARDIA)    Difficulty of Paying Living Expenses: Not hard at all  Food Insecurity: No Food Insecurity (08/02/2023)   Hunger Vital Sign    Worried About Running Out of Food in the Last Year: Never true    Ran Out of Food in the Last Year: Never true  Transportation Needs: No Transportation Needs (08/02/2023)   PRAPARE - Administrator, Civil Service (Medical): No    Lack of Transportation (Non-Medical): No  Physical Activity: Insufficiently Active (08/02/2023)   Exercise Vital Sign    Days of  Exercise per Week: 3 days    Minutes of Exercise per Session: 30 min  Stress: Stress Concern Present (08/02/2023)   [employer] of Occupational Health - Occupational Stress Questionnaire    Feeling of Stress : To some extent  Social Connections: Socially Integrated (08/02/2023)   Social Connection and Isolation Panel [NHANES]    Frequency of Communication with Friends and Family: More than three times a week    Frequency of Social Gatherings with Friends and Family: Once a week    Attends Religious Services: More than 4 times per year    Active Member of [employer] or Organizations: Yes    Attends Banker Meetings: 1 to 4 times per year    Marital Status: Married   Family History  Problem Relation Age of Onset   Kidney disease Mother    Hyperlipidemia Mother    Hypertension Mother    Heart failure Mother    Ovarian cancer Mother 57   Hyperlipidemia Father    Hypertension Father    Depression Brother    Alcohol abuse Neg Hx    COPD Neg Hx    Diabetes Neg  Hx    Early death Neg Hx    Heart disease Neg Hx    Stroke Neg Hx    Breast cancer Neg Hx    BRCA 1/2 Neg Hx    Outpatient Encounter Medications as of 09/20/2023  Medication Sig   amLODipine (NORVASC) 5 MG tablet Take 1 tablet (5 mg total) by mouth daily.   escitalopram (LEXAPRO) 10 MG tablet Take 10 mg by mouth daily.   rosuvastatin (CRESTOR) 5 MG tablet Take 1 tablet (5 mg total) by mouth daily.   spironolactone (ALDACTONE) 50 MG tablet Take 1 tablet (50 mg total) by mouth daily.   torsemide (DEMADEX) 20 MG tablet TAKE 1 TABLET BY MOUTH EVERY DAY   No facility-administered encounter medications on file as of 09/20/2023.   ALLERGIES: No Known Allergies  VACCINATION STATUS: Immunization History  Administered Date(s) Administered   Moderna Sars-Covid-2 Vaccination 10/17/2019, 11/14/2019   PPD Test 12/20/2016   Tdap 01/26/2014   Zoster Recombinant(Shingrix) 05/04/2022, 04/02/2023, 08/02/2023     HPI Tabitha Matthews is 53 y.o. female who presents today with a medical history as above. she is being seen in follow-up after she was seen in consultation for elevated PAC / PRA ratio requested by Etta Grandchild, MD.   Patient has chronic hypertension, however was not diagnosed with primary hyperaldosteronism.  Her last workup on August 02, 2023 showed plasma aldosterone concentration of 9, still suppressed renin activity at 0.17 making Aldo/PRA ratio high at 52.9 although improving from 75. In the interval, all of her blood pressure medications were switched to spironolactone 50 mg p.o. daily along with amlodipine 5 mg p.o. daily which she seems to be working better with blood pressure at 110/82 mmHg today. She also presents with continued improvement in her lipid panel with LDL at 104, generally improving from 174 mg per DL.  She remains on low-dose Crestor 5 mg p.o. nightly which she continues to tolerate. She has no new complaints today.  She denies any prior history of uncontrolled blood pressure.  She was diagnosed in her mid 30s.  She is not known to have adrenal adenoma.  She did have mild hypokalemia which required intermittent supplement with potassium.  She is not on any calcium supplement at this time.  She does have family history of hypertension, hyperlipidemia and heart failure. She denies coronary disease, CVA.  She does have uncontrolled hyperlipidemia not on treatment. She does not have recent abdominal imaging specifically no adrenal imaging studies.  She does not have acute complaints today. She is a non-smoker.   She gives family history of polycystic kidney disease.  She does have intermittently increasing serum creatinine for which she is scheduled to see a nephrologist.  Review of Systems  Constitutional: + Minimally fluctuating body weight,  no fatigue, no subjective hyperthermia, no subjective hypothermia Eyes: no blurry vision, no xerophthalmia   Objective:        09/20/2023   10:18 AM 09/13/2023    2:11 PM 08/23/2023    9:20 AM  Vitals with BMI  Height 5' 7.25" 5' 7.25"   Weight 181 lbs 6 oz 186 lbs 3 oz   BMI 28.21 28.95   Systolic 110 132 962  Diastolic 82 82 86  Pulse 76 93     BP 110/82   Pulse 76   Ht 5' 7.25" (1.708 m)   Wt 181 lb 6.4 oz (82.3 kg)   LMP 05/15/2018 (Exact Date)   BMI 28.20 kg/m   Wt  Readings from Last 3 Encounters:  09/20/23 181 lb 6.4 oz (82.3 kg)  09/13/23 186 lb 3.2 oz (84.5 kg)  08/02/23 186 lb 9.6 oz (84.6 kg)    Physical Exam  Constitutional:  Body mass index is 28.2 kg/m.,  not in acute distress, normal state of mind Eyes: PERRLA, EOMI, no exophthalmos ENT: moist mucous membranes, no gross thyromegaly, no gross cervical lymphadenopathy Cardiovascular: normal precordial activity, Regular Rate and Rhythm, no Murmur/Rubs/Gallops   CMP ( most recent) CMP     Component Value Date/Time   NA 139 09/17/2023 1210   K 4.7 09/17/2023 1210   CL 102 09/17/2023 1210   CO2 22 09/17/2023 1210   GLUCOSE 99 09/17/2023 1210   GLUCOSE 85 08/28/2023 1116   BUN 12 09/17/2023 1210   CREATININE 1.24 (H) 09/17/2023 1210   CALCIUM 10.5 (H) 09/17/2023 1210   PROT 7.8 09/17/2023 1210   ALBUMIN 4.6 09/17/2023 1210   AST 15 09/17/2023 1210   ALT 12 09/17/2023 1210   ALKPHOS 96 09/17/2023 1210   BILITOT 1.1 09/17/2023 1210   GFRNONAA >60 05/24/2018 0522   GFRAA >60 05/24/2018 0522     Diabetic Labs (most recent): Lab Results  Component Value Date   HGBA1C 5.6 05/04/2022   HGBA1C 5.5 09/24/2020   HGBA1C 5.4 11/03/2019     Lipid Panel ( most recent) Lipid Panel     Component Value Date/Time   CHOL 163 09/17/2023 1210   TRIG 118 09/17/2023 1210   HDL 38 (L) 09/17/2023 1210   CHOLHDL 4.3 09/17/2023 1210   CHOLHDL 5 05/04/2022 1543   VLDL 34.8 05/04/2022 1543   LDLCALC 104 (H) 09/17/2023 1210   LABVLDL 21 09/17/2023 1210      Lab Results  Component Value Date   TSH 2.43 04/02/2023   TSH 1.54  05/04/2022   TSH 2.39 11/03/2019   TSH 2.24 06/17/2018   TSH 1.41 12/20/2016   TSH 1.03 11/18/2015   TSH 1.54 06/10/2015   TSH 1.20 01/26/2014   TSH 1.48 09/03/2012   TSH 1.86 03/26/2007           Latest Reference Range & Units 08/02/23 10:15  ALDOSTERONE  ng/dL 9  ALDO / PRA Ratio 0.9 - 28.9 Ratio 52.9 (H)  (H): Data is abnormally high  Assessment & Plan:   1. Endocrine disorder, unspecified 2. Essential hypertension, benign 3. Mixed hyperlipidemia 4.  Hypercalcemia  - I have reviewed her  new and available endocrine  records and clinically evaluated the patient. - Based on these reviews, she continues to have mild elevation of has had abnormally elevated ratio of ALDO/PRA at 52.9, generally improving from 75.  However, she continues to have normal aldosterone level at 9, but suppressed TSH at 0.17.  She will not need further workup nor adrenal imaging at this time. Her urine electrolytes are also within normal limits. Pretest probability for primary aldosteronism is low.   -She will continue to benefit from spironolactone and amlodipine for blood pressure management, advised to continue.  In light of evidence of metabolic dysfunction including severe hyperlipidemia she was offered lifestyle medicine and low-dose Crestor to which she is responding adequately.  Her most recent LDL is 104, generally improving from 174.  - she acknowledges that there is a room for improvement in her food and drink choices. - Suggestion is made for her to avoid simple carbohydrates  from her diet including Cakes, Sweet Desserts, Ice Cream, Soda (diet and regular), Sweet Tea, Candies, Chips,  Cookies, Store Bought Juices, Alcohol , Artificial Sweeteners,  Coffee Creamer, and "Sugar-free" Products, Lemonade. This will help patient to have more stable blood glucose profile and potentially avoid unintended weight gain.  The following Lifestyle Medicine recommendations according to American College of  Lifestyle Medicine  [facility]) were discussed and and offered to patient and she  agrees to start the journey:  A. Whole Foods, Plant-Based Nutrition comprising of fruits and vegetables, plant-based proteins, whole-grain carbohydrates was discussed in detail with the patient.   A list for source of those nutrients were also provided to the patient.  Patient will use only water or unsweetened tea for hydration. B.  The need to stay away from risky substances including alcohol, smoking; obtaining 7 to 9 hours of restorative sleep, at least 150 minutes of moderate intensity exercise weekly, the importance of healthy social connections,  and stress management techniques were discussed. C.  A full color page of  Calorie density of various food groups per pound showing examples of each food groups was provided to the patient.  Mild hypercalcemia: First noticed.  She will be considered for PTH, magnesium, phosphorus along with repeat calcium before her next visit.   Due to severe hyperlipidemia putting her at risk for cardiovascular morbidity, she will benefit from early initiation of low-dose statin in addition to her lifestyle medicine.  She is advised to continue Crestor 5 mg p.o. nightly.   Side effects and precautions discussed with her.  She is advised to maintain that nephrology consult she is being considered for given intermittently increasing serum creatinine on the background of family history of polycystic kidney disease.   - she is advised to maintain close follow up with Etta Grandchild, MD for primary care needs.    I spent  26  minutes in the care of the patient today including review of labs from Thyroid Function, CMP, and other relevant labs ; imaging/biopsy records (current and previous including abstractions from other facilities); face-to-face time discussing  her lab results and symptoms, medications doses, her options of short and long term treatment based on the latest standards of care  / guidelines;   and documenting the encounter.  Tabitha Matthews  participated in the discussions, expressed understanding, and voiced agreement with the above plans.  All questions were answered to her satisfaction. she is encouraged to contact clinic should she have any questions or concerns prior to her return visit.    Follow up plan: Return in about 1 year (around 09/19/2024) for Fasting Labs  in AM B4 8.   Marquis Lunch, MD [facility] [address] [place], [place] [address] Phone: [phone]  Fax: [phone]     09/20/2023, 1:11 PM  This note was partially dictated with voice recognition software. Similar sounding words can be transcribed inadequately or may not  be corrected upon review.

## 2023-10-24 ENCOUNTER — Encounter: Payer: Self-pay | Admitting: Internal Medicine

## 2023-10-29 ENCOUNTER — Other Ambulatory Visit: Payer: Self-pay | Admitting: Internal Medicine

## 2023-10-29 DIAGNOSIS — E876 Hypokalemia: Secondary | ICD-10-CM

## 2023-10-29 DIAGNOSIS — I10 Essential (primary) hypertension: Secondary | ICD-10-CM

## 2023-11-07 ENCOUNTER — Encounter: Payer: Self-pay | Admitting: Internal Medicine

## 2023-12-20 ENCOUNTER — Other Ambulatory Visit: Payer: Self-pay | Admitting: Family

## 2023-12-20 DIAGNOSIS — I10 Essential (primary) hypertension: Secondary | ICD-10-CM

## 2023-12-20 DIAGNOSIS — N1831 Chronic kidney disease, stage 3a: Secondary | ICD-10-CM

## 2024-01-10 ENCOUNTER — Other Ambulatory Visit: Payer: Self-pay | Admitting: "Endocrinology

## 2024-01-28 ENCOUNTER — Other Ambulatory Visit: Payer: Self-pay | Admitting: Internal Medicine

## 2024-01-28 DIAGNOSIS — I10 Essential (primary) hypertension: Secondary | ICD-10-CM

## 2024-01-28 DIAGNOSIS — E876 Hypokalemia: Secondary | ICD-10-CM

## 2024-02-26 ENCOUNTER — Other Ambulatory Visit: Payer: Self-pay | Admitting: Internal Medicine

## 2024-02-26 DIAGNOSIS — I10 Essential (primary) hypertension: Secondary | ICD-10-CM

## 2024-02-26 DIAGNOSIS — E876 Hypokalemia: Secondary | ICD-10-CM

## 2024-03-26 ENCOUNTER — Other Ambulatory Visit: Payer: Self-pay | Admitting: Internal Medicine

## 2024-03-26 DIAGNOSIS — E876 Hypokalemia: Secondary | ICD-10-CM

## 2024-03-26 DIAGNOSIS — I10 Essential (primary) hypertension: Secondary | ICD-10-CM

## 2024-03-27 ENCOUNTER — Encounter: Payer: Self-pay | Admitting: "Endocrinology

## 2024-03-27 NOTE — Telephone Encounter (Signed)
 Last OV 09/13/23, f/u 6 months Next OV not scheduled  Last refill(s) Amlodipine  02/27/24 Qty #30/0  Spironolactone  02/27/24 Qty #30/0   Pt past due for 6 month f/u, please reach out to assist with scheduling.

## 2024-03-28 ENCOUNTER — Other Ambulatory Visit: Payer: Self-pay

## 2024-03-28 DIAGNOSIS — E782 Mixed hyperlipidemia: Secondary | ICD-10-CM

## 2024-03-28 MED ORDER — ROSUVASTATIN CALCIUM 5 MG PO TABS: 5.0000 mg | ORAL_TABLET | Freq: Every day | ORAL | 1 refills | Status: AC

## 2024-03-30 ENCOUNTER — Other Ambulatory Visit: Payer: Self-pay | Admitting: Internal Medicine

## 2024-03-30 DIAGNOSIS — I10 Essential (primary) hypertension: Secondary | ICD-10-CM

## 2024-04-05 ENCOUNTER — Encounter: Payer: Self-pay | Admitting: Internal Medicine

## 2024-04-07 ENCOUNTER — Other Ambulatory Visit: Payer: Self-pay

## 2024-04-07 ENCOUNTER — Other Ambulatory Visit: Payer: Self-pay | Admitting: Internal Medicine

## 2024-04-07 DIAGNOSIS — E876 Hypokalemia: Secondary | ICD-10-CM

## 2024-04-07 DIAGNOSIS — I10 Essential (primary) hypertension: Secondary | ICD-10-CM

## 2024-04-07 NOTE — Telephone Encounter (Unsigned)
 Copied from CRM #[id number]. Topic: Clinical - Medication Refill >> Apr 07, 2024  1:56 PM Jayma L wrote: Medication: spironolactone  (ALDACTONE ) 50 MG tablet  Has the patient contacted their pharmacy? No (Agent: If no, request that the patient contact the pharmacy for the refill. If patient does not wish to contact the pharmacy document the reason why and proceed with request.) (Agent: If yes, when and what did the pharmacy advise?)  This is the patient's preferred pharmacy:  [id number] [place], [place] - [address] [place] [address] Phone: [phone] Fax: [phone]  Is this the correct pharmacy for this prescription? Yes If no, delete pharmacy and type the correct one.   Has the prescription been filled recently? No  Is the patient out of the medication? No  Has the patient been seen for an appointment in the last year OR does the patient have an upcoming appointment? Yes  Can we respond through MyChart? Yes  Agent: Please be advised that Rx refills may take up to 3 business days. We ask that you follow-up with your pharmacy.

## 2024-04-09 MED ORDER — SPIRONOLACTONE 50 MG PO TABS
50.0000 mg | ORAL_TABLET | Freq: Every day | ORAL | 0 refills | Status: DC
Start: 2024-04-09 — End: 2024-05-09

## 2024-04-29 ENCOUNTER — Other Ambulatory Visit: Payer: Self-pay | Admitting: Internal Medicine

## 2024-04-29 DIAGNOSIS — I10 Essential (primary) hypertension: Secondary | ICD-10-CM

## 2024-05-03 ENCOUNTER — Other Ambulatory Visit: Payer: Self-pay | Admitting: Internal Medicine

## 2024-05-03 DIAGNOSIS — I10 Essential (primary) hypertension: Secondary | ICD-10-CM

## 2024-05-03 DIAGNOSIS — E876 Hypokalemia: Secondary | ICD-10-CM

## 2024-05-06 ENCOUNTER — Ambulatory Visit: Admitting: Internal Medicine

## 2024-05-06 ENCOUNTER — Encounter: Payer: Self-pay | Admitting: Internal Medicine

## 2024-05-06 ENCOUNTER — Ambulatory Visit: Payer: Self-pay | Admitting: Internal Medicine

## 2024-05-06 VITALS — BP 124/88 | HR 95 | Temp 98.3°F | Resp 16 | Ht 67.25 in | Wt 191.6 lb

## 2024-05-06 DIAGNOSIS — N1831 Chronic kidney disease, stage 3a: Secondary | ICD-10-CM

## 2024-05-06 DIAGNOSIS — R778 Other specified abnormalities of plasma proteins: Secondary | ICD-10-CM

## 2024-05-06 DIAGNOSIS — I10 Essential (primary) hypertension: Secondary | ICD-10-CM

## 2024-05-06 DIAGNOSIS — Z23 Encounter for immunization: Secondary | ICD-10-CM

## 2024-05-06 DIAGNOSIS — E559 Vitamin D deficiency, unspecified: Secondary | ICD-10-CM | POA: Diagnosis not present

## 2024-05-06 DIAGNOSIS — R1030 Lower abdominal pain, unspecified: Secondary | ICD-10-CM | POA: Diagnosis not present

## 2024-05-06 DIAGNOSIS — E269 Hyperaldosteronism, unspecified: Secondary | ICD-10-CM

## 2024-05-06 DIAGNOSIS — E876 Hypokalemia: Secondary | ICD-10-CM

## 2024-05-06 LAB — HEPATIC FUNCTION PANEL
ALT: 12 U/L (ref 0–35)
AST: 15 U/L (ref 0–37)
Albumin: 4.7 g/dL (ref 3.5–5.2)
Alkaline Phosphatase: 71 U/L (ref 39–117)
Bilirubin, Direct: 0.2 mg/dL (ref 0.0–0.3)
Total Bilirubin: 1.4 mg/dL — ABNORMAL HIGH (ref 0.2–1.2)
Total Protein: 8.4 g/dL — ABNORMAL HIGH (ref 6.0–8.3)

## 2024-05-06 LAB — CBC WITH DIFFERENTIAL/PLATELET
Basophils Absolute: 0 K/uL (ref 0.0–0.1)
Basophils Relative: 0.9 % (ref 0.0–3.0)
Eosinophils Absolute: 0 K/uL (ref 0.0–0.7)
Eosinophils Relative: 1 % (ref 0.0–5.0)
HCT: 37.9 % (ref 36.0–46.0)
Hemoglobin: 12.8 g/dL (ref 12.0–15.0)
Lymphocytes Relative: 30.1 % (ref 12.0–46.0)
Lymphs Abs: 1.4 K/uL (ref 0.7–4.0)
MCHC: 33.8 g/dL (ref 30.0–36.0)
MCV: 93.8 fl (ref 78.0–100.0)
Monocytes Absolute: 0.3 K/uL (ref 0.1–1.0)
Monocytes Relative: 7.3 % (ref 3.0–12.0)
Neutro Abs: 2.9 K/uL (ref 1.4–7.7)
Neutrophils Relative %: 60.7 % (ref 43.0–77.0)
Platelets: 343 K/uL (ref 150.0–400.0)
RBC: 4.04 Mil/uL (ref 3.87–5.11)
RDW: 12.7 % (ref 11.5–15.5)
WBC: 4.7 K/uL (ref 4.0–10.5)

## 2024-05-06 LAB — URINALYSIS, ROUTINE W REFLEX MICROSCOPIC
Bilirubin Urine: NEGATIVE
Hgb urine dipstick: NEGATIVE
Ketones, ur: NEGATIVE
Leukocytes,Ua: NEGATIVE
Nitrite: NEGATIVE
Specific Gravity, Urine: 1.01 (ref 1.000–1.030)
Total Protein, Urine: NEGATIVE
Urine Glucose: NEGATIVE
Urobilinogen, UA: 0.2 (ref 0.0–1.0)
pH: 6 (ref 5.0–8.0)

## 2024-05-06 LAB — VITAMIN D 25 HYDROXY (VIT D DEFICIENCY, FRACTURES): VITD: 16.42 ng/mL — ABNORMAL LOW (ref 30.00–100.00)

## 2024-05-06 LAB — TSH: TSH: 2.3 u[IU]/mL (ref 0.35–5.50)

## 2024-05-06 LAB — TRIGLYCERIDES: Triglycerides: 115 mg/dL (ref 0.0–149.0)

## 2024-05-06 LAB — LIPASE: Lipase: 34 U/L (ref 11.0–59.0)

## 2024-05-06 MED ORDER — DAPAGLIFLOZIN PROPANEDIOL 10 MG PO TABS: 10.0000 mg | ORAL_TABLET | Freq: Every day | ORAL | 0 refills | Status: DC

## 2024-05-06 MED ORDER — CHOLECALCIFEROL 1.25 MG (50000 UT) PO CAPS: 50000.0000 [IU] | ORAL_CAPSULE | ORAL | 0 refills | Status: DC

## 2024-05-06 NOTE — Progress Notes (Unsigned)
 Subjective:  Patient ID: Tabitha Matthews Single, female    DOB: 1971-05-04  Age: 53 y.o. MRN: [id number]  CC: Hypertension, Hyperlipidemia, and Abdominal Pain   HPI Tabitha Matthews presents for f/up ----  Discussed the use of AI scribe software for clinical note transcription with the patient, who gave verbal consent to proceed.  History of Present Illness Tabitha Matthews is a 53 year old female with irritable bowel syndrome who presents with abdominal pain after eating.  She has been experiencing intermittent lower abdominal pain for the past few weeks, typically occurring after meals. The pain is described as cramp-like and sometimes resolves after passing gas. She has not taken any medication for this pain.  She has a history of irritable bowel syndrome and was previously prescribed medication for stomach pains, but she is not currently on any regular treatment for IBS. No changes in bowel movements, urinary symptoms, nausea, vomiting, fever, chills, or blood in stool.  Her social history includes staying active by running her daughter to volleyball and doing consulting work in education since retirement. She has received COVID vaccines but not the latest one this year, and she has not had flu or pneumonia vaccines. Her last tetanus shot was ten years ago.     Outpatient Medications Prior to Visit  Medication Sig Dispense Refill   rosuvastatin  (CRESTOR ) 5 MG tablet Take 1 tablet (5 mg total) by mouth daily. 90 tablet 1   amLODipine  (NORVASC ) 5 MG tablet TAKE 1 TABLET (5 MG TOTAL) BY MOUTH DAILY. NEEDS APPOINTMENT FOR REFILLS 30 tablet 0   spironolactone  (ALDACTONE ) 50 MG tablet Take 1 tablet (50 mg total) by mouth daily. NEEDS APPOINTMENT FOR REFILLS 30 tablet 0   escitalopram (LEXAPRO) 10 MG tablet Take 10 mg by mouth daily.     torsemide  (DEMADEX ) 20 MG tablet TAKE 1 TABLET BY MOUTH EVERY DAY 90 tablet 0   No facility-administered medications prior to visit.    ROS Review of  Systems  Constitutional:  Positive for unexpected weight change (wt gain). Negative for appetite change, chills, diaphoresis and fatigue.  HENT: Negative.  Negative for trouble swallowing.   Eyes: Negative.   Gastrointestinal:  Positive for abdominal pain.  Endocrine: Negative.   Genitourinary: Negative.  Negative for difficulty urinating and dyspareunia.  Neurological:  Negative for dizziness and weakness.  Hematological:  Negative for adenopathy. Does not bruise/bleed easily.  Psychiatric/Behavioral: Negative.      Objective:  BP 124/88 (BP Location: Left Arm, Patient Position: Sitting, Cuff Size: Normal)   Pulse 95   Temp 98.3 F (36.8 C) (Oral)   Resp 16   Ht 5' 7.25 (1.708 m)   Wt 191 lb 9.6 oz (86.9 kg)   LMP 05/15/2018 (Exact Date)   SpO2 99%   BMI 29.79 kg/m   BP Readings from Last 3 Encounters:  05/06/24 124/88  09/20/23 110/82  09/13/23 132/82    Wt Readings from Last 3 Encounters:  05/06/24 191 lb 9.6 oz (86.9 kg)  09/20/23 181 lb 6.4 oz (82.3 kg)  09/13/23 186 lb 3.2 oz (84.5 kg)    Physical Exam Vitals reviewed.  Constitutional:      Appearance: Normal appearance.  HENT:     Nose: Nose normal.     Mouth/Throat:     Mouth: Mucous membranes are moist.  Eyes:     General: No scleral icterus.    Conjunctiva/sclera: Conjunctivae normal.  Cardiovascular:     Rate and Rhythm: Normal rate and regular  rhythm.     Heart sounds: No murmur heard.    No friction rub. No gallop.     Comments: EKG--- NSR, 77 bpm LAE Anterior infarct pattern Unchanged  Pulmonary:     Effort: Pulmonary effort is normal.     Breath sounds: No stridor. No wheezing, rhonchi or rales.  Abdominal:     General: Abdomen is flat. Bowel sounds are normal. There is no distension.     Palpations: Abdomen is soft. There is no hepatomegaly, splenomegaly or mass.     Tenderness: There is no abdominal tenderness. There is no guarding or rebound.     Hernia: No hernia is present.   Musculoskeletal:        General: Normal range of motion.     Cervical back: Neck supple.     Right lower leg: No edema.     Left lower leg: No edema.  Lymphadenopathy:     Cervical: No cervical adenopathy.  Skin:    General: Skin is warm and dry.  Neurological:     General: No focal deficit present.     Mental Status: She is alert. Mental status is at baseline.  Psychiatric:        Mood and Affect: Mood normal.        Behavior: Behavior normal.     Lab Results  Component Value Date   WBC 4.7 05/06/2024   HGB 12.8 05/06/2024   HCT 37.9 05/06/2024   PLT 343.0 05/06/2024   GLUCOSE 104 (H) 05/06/2024   CHOL 163 09/17/2023   TRIG 115.0 05/06/2024   HDL 38 (L) 09/17/2023   LDLCALC 104 (H) 09/17/2023   ALT 12 05/06/2024   AST 15 05/06/2024   NA 136 05/06/2024   K 3.8 05/06/2024   CL 103 05/06/2024   CREATININE 1.29 (H) 05/06/2024   BUN 14 05/06/2024   CO2 25 05/06/2024   TSH 2.30 05/06/2024   HGBA1C 5.6 05/04/2022    US  Renal Artery Stenosis Result Date: 08/11/2023 CLINICAL DATA:  Hypertension EXAM: RENAL/URINARY TRACT ULTRASOUND RENAL DUPLEX DOPPLER ULTRASOUND COMPARISON:  None Available. FINDINGS: Right Kidney: Length: 11.5 cm. Echogenic renal parenchyma with increased conspicuity of the corticomedullary interface. No mass, hydronephrosis or nephrolithiasis. Left Kidney: Length: 11.3 cm. Echogenic renal parenchyma with increased conspicuity of the corticomedullary interface. No mass, hydronephrosis or nephrolithiasis. Bladder:  Within normal limits RENAL DUPLEX ULTRASOUND Right Renal Artery Velocities: Origin:  97 cm/sec Mid:  95 cm/sec Hilum:  105 cm/sec Interlobar:  77 cm/sec Arcuate:  45 cm/sec Left Renal Artery Velocities: Origin:  88 cm/sec Mid:  88 cm/sec Hilum:  103 cm/sec Interlobar:  68 cm/sec Arcuate:  39 cm/sec Aortic Velocity:  96 cm/sec Right Renal-Aortic Ratios: Origin: 1.0 Mid:  1.0 Hilum: 1.1 Interlobar: 0.8 Arcuate: 0.5 Left Renal-Aortic Ratios: Origin: 0.9  Mid: 0.9 Hilum: 1.1 Interlobar: 0.7 Arcuate: 0.4 IMPRESSION: 1. No evidence of hemodynamically significant renal artery stenosis. 2. Echogenic kidneys bilaterally suggests underlying medical renal disease. Electronically Signed   By: Wilkie Lent M.D.   On: 08/11/2023 06:30    Assessment & Plan:   Lower abdominal pain- Exam and labs are reassuring. -     Lipase; Future -     CBC with Differential/Platelet; Future -     Hepatic function panel; Future -     Triglycerides; Future  Essential hypertension, benign- BP is well controlled. EKG is negative for LVH. -     Basic metabolic panel with GFR; Future -  CBC with Differential/Platelet; Future -     TSH; Future -     Urinalysis, Routine w reflex microscopic; Future -     Hepatic function panel; Future -     EKG 12-Lead -     amLODIPine  Besylate; Take 1 tablet (5 mg total) by mouth daily.  Dispense: 90 tablet; Refill: 0  Vitamin D  deficiency disease -     VITAMIN D  25 Hydroxy (Vit-D Deficiency, Fractures); Future -     Cholecalciferol ; Take 1 capsule (50,000 Units total) by mouth once a week.  Dispense: 12 capsule; Refill: 0  Immunization due -     Tdap vaccine greater than or equal to 7yo IM  Stage 3a chronic kidney disease (HCC)- Will avoid nephrotoxic agents  -     Dapagliflozin  Propanediol; Take 1 tablet (10 mg total) by mouth daily.  Dispense: 90 tablet; Refill: 0  Elevated total protein    Hyperaldosteronism (HCC) -     Spironolactone ; Take 1 tablet (50 mg total) by mouth daily.  Dispense: 90 tablet; Refill: 0     Follow-up: Return if symptoms worsen or fail to improve.  Debby Molt, MD

## 2024-05-06 NOTE — Patient Instructions (Signed)
 Abdominal Pain, Adult  Pain in the abdomen (abdominal pain) can be caused by many things. In most cases, it gets better with no treatment or by being treated at home. But in some cases, it can be serious. Your health care provider will ask questions about your medical history and do a physical exam to try to figure out what is causing your pain. Follow these instructions at home: Medicines Take over-the-counter and prescription medicines only as told by your provider. Do not take medicines that help you poop (laxatives) unless told by your provider. General instructions Watch your condition for any changes. Drink enough fluid to keep your pee (urine) pale yellow. Contact a health care provider if: Your pain changes, gets worse, or lasts longer than expected. You have severe cramping or bloating in your abdomen, or you vomit. Your pain gets worse with meals, after eating, or with certain foods. You are constipated or have diarrhea for more than 2-3 days. You are not hungry, or you lose weight without trying. You have signs of dehydration. These may include: Dark pee, very little pee, or no pee. Cracked lips or dry mouth. Sleepiness or weakness. You have pain when you pee (urinate) or poop. Your abdominal pain wakes you up at night. You have blood in your pee. You have a fever. Get help right away if: You cannot stop vomiting. Your pain is only in one part of the abdomen. Pain on the right side could be caused by appendicitis. You have bloody or black poop (stool), or poop that looks like tar. You have trouble breathing. You have chest pain. These symptoms may be an emergency. Get help right away. Call 911. Do not wait to see if the symptoms will go away. Do not drive yourself to the hospital. This information is not intended to replace advice given to you by your health care provider. Make sure you discuss any questions you have with your health care provider. Document Revised:  05/24/2022 Document Reviewed: 05/24/2022 Elsevier Patient Education  2024 [employer].

## 2024-05-09 LAB — BASIC METABOLIC PANEL WITH GFR
BUN: 14 mg/dL (ref 6–23)
CO2: 25 meq/L (ref 19–32)
Calcium: 10.4 mg/dL (ref 8.4–10.5)
Chloride: 103 meq/L (ref 96–112)
Creatinine, Ser: 1.29 mg/dL — ABNORMAL HIGH (ref 0.40–1.20)
GFR: 47.25 mL/min — ABNORMAL LOW (ref >60.00)
Glucose, Bld: 104 mg/dL — ABNORMAL HIGH (ref 70–99)
Potassium: 3.8 meq/L (ref 3.5–5.1)
Sodium: 136 meq/L — AB (ref 135–145)

## 2024-05-09 MED ORDER — SPIRONOLACTONE 50 MG PO TABS: 50.0000 mg | ORAL_TABLET | Freq: Every day | ORAL | 0 refills | Status: DC

## 2024-05-09 MED ORDER — AMLODIPINE BESYLATE 5 MG PO TABS: 5.0000 mg | ORAL_TABLET | Freq: Every day | ORAL | 0 refills | Status: DC

## 2024-05-10 ENCOUNTER — Encounter: Payer: Self-pay | Admitting: Internal Medicine

## 2024-05-10 DIAGNOSIS — E559 Vitamin D deficiency, unspecified: Secondary | ICD-10-CM

## 2024-05-12 MED ORDER — CHOLECALCIFEROL 1.25 MG (50000 UT) PO CAPS: 50000.0000 [IU] | ORAL_CAPSULE | ORAL | 0 refills | Status: DC

## 2024-05-12 NOTE — Telephone Encounter (Unsigned)
 Copied from CRM [id number]. Topic: Clinical - Prescription Issue >> May 09, 2024  4:15 PM Dedra B wrote: Reason for CRM: Pt calling to follow up on vit d prescription. The pharmacy is still saying they don't have it.

## 2024-06-04 LAB — LAB REPORT - SCANNED: EGFR: 41

## 2024-06-13 ENCOUNTER — Encounter: Payer: Self-pay | Admitting: Internal Medicine

## 2024-06-13 ENCOUNTER — Other Ambulatory Visit: Payer: Self-pay | Admitting: Internal Medicine

## 2024-06-13 DIAGNOSIS — Z0000 Encounter for general adult medical examination without abnormal findings: Secondary | ICD-10-CM

## 2024-06-16 ENCOUNTER — Encounter: Payer: Self-pay | Admitting: Internal Medicine

## 2024-06-17 ENCOUNTER — Other Ambulatory Visit: Payer: Self-pay | Admitting: Internal Medicine

## 2024-06-18 NOTE — Telephone Encounter (Signed)
 She has been asked to stop taking this

## 2024-07-02 ENCOUNTER — Encounter

## 2024-07-27 ENCOUNTER — Other Ambulatory Visit: Payer: Self-pay | Admitting: Internal Medicine

## 2024-07-27 DIAGNOSIS — E559 Vitamin D deficiency, unspecified: Secondary | ICD-10-CM

## 2024-07-28 ENCOUNTER — Ambulatory Visit
Admission: RE | Admit: 2024-07-28 | Discharge: 2024-07-28 | Disposition: A | Source: Ambulatory Visit | Attending: Internal Medicine | Admitting: Internal Medicine

## 2024-07-28 DIAGNOSIS — Z0000 Encounter for general adult medical examination without abnormal findings: Secondary | ICD-10-CM

## 2024-08-05 ENCOUNTER — Other Ambulatory Visit: Payer: Self-pay | Admitting: Internal Medicine

## 2024-08-05 DIAGNOSIS — E269 Hyperaldosteronism, unspecified: Secondary | ICD-10-CM

## 2024-09-09 ENCOUNTER — Other Ambulatory Visit: Payer: Self-pay | Admitting: Internal Medicine

## 2024-09-09 DIAGNOSIS — I10 Essential (primary) hypertension: Secondary | ICD-10-CM

## 2024-09-11 ENCOUNTER — Other Ambulatory Visit: Payer: Self-pay

## 2024-09-11 DIAGNOSIS — I10 Essential (primary) hypertension: Secondary | ICD-10-CM

## 2024-09-11 DIAGNOSIS — E782 Mixed hyperlipidemia: Secondary | ICD-10-CM

## 2024-09-11 DIAGNOSIS — E349 Endocrine disorder, unspecified: Secondary | ICD-10-CM

## 2024-09-11 NOTE — Progress Notes (Signed)
 Labs placed for patient to collect

## 2024-09-18 ENCOUNTER — Encounter: Payer: Self-pay | Admitting: "Endocrinology

## 2024-09-18 ENCOUNTER — Ambulatory Visit: Payer: 59 | Admitting: "Endocrinology

## 2024-09-18 DIAGNOSIS — E782 Mixed hyperlipidemia: Secondary | ICD-10-CM

## 2024-09-18 DIAGNOSIS — I10 Essential (primary) hypertension: Secondary | ICD-10-CM

## 2024-09-18 NOTE — Progress Notes (Signed)
 "                                                      09/18/2024, 10:55 AM  Endocrinology follow-up note   Subjective:    Patient ID: Tabitha Matthews Single, female    DOB: August 15, 1971, PCP Joshua Debby CROME, MD   Past Medical History:  Diagnosis Date   Anemia    Hyperlipidemia    Hypertension 05/17/2018   IDA (iron  deficiency anemia) 05/17/2018   Past Surgical History:  Procedure Laterality Date   CESAREAN SECTION     ROBOTIC ASSISTED LAPAROSCOPIC HYSTERECTOMY AND SALPINGECTOMY Bilateral 05/23/2018   Procedure: XI ROBOTIC ASSISTED LAPAROSCOPIC HYSTERECTOMY AND SALPINGECTOMY;  Surgeon: Rutherford Gain, MD;  Location: [facility];  Service: Gynecology;  Laterality: Bilateral;   Social History   Socioeconomic History   Marital status: Married    Spouse name: Not on file   Number of children: Not on file   Years of education: Not on file   Highest education level: Doctorate  Occupational History   Not on file  Tobacco Use   Smoking status: Never    Passive exposure: Never   Smokeless tobacco: Never  Vaping Use   Vaping status: Never Used  Substance and Sexual Activity   Alcohol use: No   Drug use: No   Sexual activity: Yes    Partners: Male    Birth control/protection: I.U.D.  Other Topics Concern   Not on file  Social History Narrative   Not on file   Social Drivers of Health   Tobacco Use: Low Risk (09/18/2024)   Patient History    Smoking Tobacco Use: Never    Smokeless Tobacco Use: Never    Passive Exposure: Never  Financial Resource Strain: Low Risk (05/02/2024)   Overall Financial Resource Strain (CARDIA)    Difficulty of Paying Living Expenses: Not very hard  Food Insecurity: No Food Insecurity (05/02/2024)   Epic    Worried About Programme Researcher, Broadcasting/film/video in the Last Year: Never true    Ran Out of Food in the Last Year: Never true  Transportation Needs: No Transportation Needs (05/02/2024)   Epic    Lack of Transportation (Medical): No    Lack of Transportation  (Non-Medical): No  Physical Activity: Insufficiently Active (05/02/2024)   Exercise Vital Sign    Days of Exercise per Week: 2 days    Minutes of Exercise per Session: 10 min  Stress: No Stress Concern Present (05/02/2024)   [employer] of Occupational Health - Occupational Stress Questionnaire    Feeling of Stress: Not at all  Social Connections: Socially Integrated (05/02/2024)   Social Connection and Isolation Panel    Frequency of Communication with Friends and Family: More than three times a week    Frequency of Social Gatherings with Friends and Family: Once a week    Attends Religious Services: More than 4 times per year    Active Member of Clubs or Organizations: Yes    Attends Banker Meetings: More than 4 times per year    Marital Status: Married  Depression (PHQ2-9): Low Risk (05/06/2024)   Depression (PHQ2-9)    PHQ-2 Score: 0  Alcohol Screen: Low Risk (05/02/2024)   Alcohol Screen    Last Alcohol Screening Score (AUDIT): 2  Housing: Unknown (05/02/2024)   Epic  Unable to Pay for Housing in the Last Year: No    Number of Times Moved in the Last Year: Not on file    Homeless in the Last Year: No  Utilities: Not on file  Health Literacy: Not on file   Family History  Problem Relation Age of Onset   Kidney disease Mother    Hyperlipidemia Mother    Hypertension Mother    Heart failure Mother    Ovarian cancer Mother 28   Hyperlipidemia Father    Hypertension Father    Depression Brother    Alcohol abuse Neg Hx    COPD Neg Hx    Diabetes Neg Hx    Early death Neg Hx    Heart disease Neg Hx    Stroke Neg Hx    Breast cancer Neg Hx    BRCA 1/2 Neg Hx    Outpatient Encounter Medications as of 09/18/2024  Medication Sig   Cholecalciferol  (VITAMIN D ) 50 MCG (2000 UT) CAPS Take 2,000 Units by mouth daily.   escitalopram (LEXAPRO) 20 MG tablet Take 20 mg by mouth daily.   amLODipine  (NORVASC ) 5 MG tablet TAKE 1 TABLET (5 MG TOTAL) BY MOUTH DAILY.    rosuvastatin  (CRESTOR ) 5 MG tablet Take 1 tablet (5 mg total) by mouth daily.   spironolactone  (ALDACTONE ) 50 MG tablet TAKE 1 TABLET BY MOUTH EVERY DAY   [DISCONTINUED] Cholecalciferol  (VITAMIN D3) 1.25 MG (50000 UT) CAPS TAKE 1 CAPSULE BY MOUTH ONE TIME PER WEEK   No facility-administered encounter medications on file as of 09/18/2024.   ALLERGIES: No Known Allergies  VACCINATION STATUS: Immunization History  Administered Date(s) Administered   Moderna Sars-Covid-2 Vaccination 10/17/2019, 11/14/2019   PPD Test 12/20/2016   Tdap 01/26/2014, 05/06/2024   Zoster Recombinant(Shingrix ) 05/04/2022, 04/02/2023, 08/02/2023    Tabitha Matthews is 54 y.o. female who presents today with a medical history as above. she is being seen in follow-up after she was seen in consultation for elevated PAC / PRA ratio requested by Joshua Debby CROME, MD.   Patient has chronic hypertension, controlled on 2 medications including amlodipine  and spironolactone .   She continued to have ideal control of blood pressure currently at 112/80 mmHg. She has responded to low-dose Crestor  to control dyslipidemia, LDL at 95 improving from 174.    She has no new complaints today.  She denies any prior history of uncontrolled blood pressure.  She was diagnosed in her mid 30s.  She is not known to have adrenal adenoma.  She did have mild hypokalemia which required intermittent supplement with potassium.  She is not on any calcium  supplement at this time-her previsit labs show mild hypercalcemia which seems to be PTH independent.  She does have family history of hypertension, hyperlipidemia and heart failure. She denies coronary disease, CVA.   She does not have recent abdominal imaging specifically no adrenal imaging studies.  She does not have acute complaints today. She is a non-smoker.   She gives family history of polycystic kidney disease.  She does have intermittently increasing serum creatinine for which she is  following up with a nephrologist.    Review of Systems  Constitutional: + Presents with 11 pounds of weight loss since last visit,   no fatigue, no subjective hyperthermia, no subjective hypothermia Eyes: no blurry vision, no xerophthalmia   Objective:       09/18/2024   10:11 AM 05/06/2024   10:25 AM 09/20/2023   10:18 AM  Vitals with BMI  Height 5' 7.25 5' 7.25 5' 7.25  Weight 180 lbs 3 oz 191 lbs 10 oz 181 lbs 6 oz  BMI 28.02 29.79 28.21  Systolic 112 124 889  Diastolic 80 88 82  Pulse 76 95 76    BP 112/80   Pulse 76   Ht 5' 7.25 (1.708 m)   Wt 180 lb 3.2 oz (81.7 kg)   LMP 05/15/2018   BMI 28.01 kg/m   Wt Readings from Last 3 Encounters:  09/18/24 180 lb 3.2 oz (81.7 kg)  05/06/24 191 lb 9.6 oz (86.9 kg)  09/20/23 181 lb 6.4 oz (82.3 kg)    Physical Exam  Constitutional:  Body mass index is 28.01 kg/m.,  not in acute distress, normal state of mind Eyes: PERRLA, EOMI, no exophthalmos ENT: moist mucous membranes, no gross thyromegaly, no gross cervical lymphadenopathy Cardiovascular: normal precordial activity, Regular Rate and Rhythm, no Murmur/Rubs/Gallops   CMP ( most recent) CMP     Component Value Date/Time   NA 138 09/12/2024 0828   K 4.5 09/12/2024 0828   CL 101 09/12/2024 0828   CO2 22 09/12/2024 0828   GLUCOSE 95 09/12/2024 0828   GLUCOSE 104 (H) 05/06/2024 1123   BUN 16 09/12/2024 0828   CREATININE 1.47 (H) 09/12/2024 0828   CALCIUM  10.3 (H) 09/12/2024 0828   PROT 8.0 09/12/2024 0828   ALBUMIN 4.6 09/12/2024 0828   AST 20 09/12/2024 0828   ALT 15 09/12/2024 0828   ALKPHOS 97 09/12/2024 0828   BILITOT 1.2 09/12/2024 0828   GFRNONAA >60 05/24/2018 0522   GFRAA >60 05/24/2018 0522     Diabetic Labs (most recent): Lab Results  Component Value Date   HGBA1C 5.6 05/04/2022   HGBA1C 5.5 09/24/2020   HGBA1C 5.4 11/03/2019     Lipid Panel ( most recent) Lipid Panel     Component Value Date/Time   CHOL 159 09/12/2024 0828   TRIG 96  09/12/2024 0828   HDL 46 09/12/2024 0828   CHOLHDL 3.5 09/12/2024 0828   CHOLHDL 5 05/04/2022 1543   VLDL 34.8 05/04/2022 1543   LDLCALC 95 09/12/2024 0828   LABVLDL 18 09/12/2024 0828      Lab Results  Component Value Date   TSH 2.30 05/06/2024   TSH 2.43 04/02/2023   TSH 1.54 05/04/2022   TSH 2.39 11/03/2019   TSH 2.24 06/17/2018   TSH 1.41 12/20/2016   TSH 1.03 11/18/2015   TSH 1.54 06/10/2015   TSH 1.20 01/26/2014   TSH 1.48 09/03/2012    Recent Results (from the past 2160 hours)  Phosphorus     Status: None   Collection Time: 09/12/24  8:28 AM  Result Value Ref Range   Phosphorus 3.5 3.0 - 4.3 mg/dL  Magnesium     Status: None   Collection Time: 09/12/24  8:28 AM  Result Value Ref Range   Magnesium 1.6 1.6 - 2.3 mg/dL  PTH, Intact and Calcium      Status: None   Collection Time: 09/12/24  8:28 AM  Result Value Ref Range   PTH 33 15 - 65 pg/mL   PTH Interp Comment     Comment: Interpretation                 Intact PTH    Calcium                                  (pg/mL)      (  mg/dL) Normal                          15 - 65     8.6 - 10.2 Primary Hyperparathyroidism         >65          >10.2 Secondary Hyperparathyroidism       >65          <10.2 Non-Parathyroid Hypercalcemia       <65          >10.2 Hypoparathyroidism                  <15          < 8.6 Non-Parathyroid Hypocalcemia    15 - 65          < 8.6   Comprehensive metabolic panel     Status: Abnormal   Collection Time: 09/12/24  8:28 AM  Result Value Ref Range   Glucose 95 70 - 99 mg/dL   BUN 16 6 - 24 mg/dL   Creatinine, Ser 8.52 (H) 0.57 - 1.00 mg/dL   eGFR 42 (L) >40 fO/fpw/8.26   BUN/Creatinine Ratio 11 9 - 23   Sodium 138 134 - 144 mmol/L   Potassium 4.5 3.5 - 5.2 mmol/L   Chloride 101 96 - 106 mmol/L   CO2 22 20 - 29 mmol/L   Calcium  10.3 (H) 8.7 - 10.2 mg/dL   Total Protein 8.0 6.0 - 8.5 g/dL   Albumin 4.6 3.8 - 4.9 g/dL   Globulin, Total 3.4 1.5 - 4.5 g/dL   Bilirubin Total 1.2 0.0 - 1.2  mg/dL   Alkaline Phosphatase 97 49 - 135 IU/L   AST 20 0 - 40 IU/L   ALT 15 0 - 32 IU/L  Aldosterone + renin activity w/ ratio     Status: None (Preliminary result)   Collection Time: 09/12/24  8:28 AM  Result Value Ref Range   Aldosterone 11.9 0.0 - 30.0 ng/dL   Renin Activity, Plasma WILL FOLLOW    Aldos/Renin Ratio WILL FOLLOW   Lipid Panel     Status: None   Collection Time: 09/12/24  8:28 AM  Result Value Ref Range   Cholesterol, Total 159 100 - 199 mg/dL   Triglycerides 96 0 - 149 mg/dL   HDL 46 >60 mg/dL   VLDL Cholesterol Cal 18 5 - 40 mg/dL   LDL Chol Calc (NIH) 95 0 - 99 mg/dL   Chol/HDL Ratio 3.5 0.0 - 4.4 ratio    Comment:                                   T. Chol/HDL Ratio                                             Men  Women                               1/2 Avg.Risk  3.4    3.3                                   Avg.Risk  5.0    4.4                                2X Avg.Risk  9.6    7.1                                3X Avg.Risk 23.4   11.0      Assessment & Plan:   1. Endocrine disorder, unspecified 2. Essential hypertension, benign 3. Mixed hyperlipidemia 4.  Hypercalcemia  - I have reviewed her  new and available endocrine  records and clinically evaluated the patient. - Her previsit labs show continued display of normal aldosterone at 11.9, plasma renin activity and ratio are pending.  She will not need further workup nor adrenal imaging at this time. Her urine electrolytes are also within normal limits. Pretest probability for primary aldosteronism is low.   -She will continue to benefit from spironolactone  and amlodipine  for blood pressure management, advised to continue.  In light of evidence of metabolic dysfunction including severe hyperlipidemia she was offered lifestyle medicine and low-dose Crestor  to which she is responding adequately.  Her most recent LDL is 95, overall improving from 174.  She is advised to continue Crestor  5 mg p.o. nightly  and lifestyle medicine nutrition as follows.   - she acknowledges that there is a room for improvement in her food and drink choices. - Suggestion is made for her to avoid simple carbohydrates  from her diet including Cakes, Sweet Desserts, Ice Cream, Soda (diet and regular), Sweet Tea, Candies, Chips, Cookies, Store Bought Juices, Alcohol , Artificial Sweeteners,  Coffee Creamer, and Sugar-free Products, Lemonade. This will help patient to have more stable blood glucose profile and potentially avoid unintended weight gain.  The following Lifestyle Medicine recommendations according to American College of Lifestyle Medicine  [facility]) were discussed and and offered to patient and she  agrees to start the journey:  A. Whole Foods, Plant-Based Nutrition comprising of fruits and vegetables, plant-based proteins, whole-grain carbohydrates was discussed in detail with the patient.   A list for source of those nutrients were also provided to the patient.  Patient will use only water or unsweetened tea for hydration. B.  The need to stay away from risky substances including alcohol, smoking; obtaining 7 to 9 hours of restorative sleep, at least 150 minutes of moderate intensity exercise weekly, the importance of healthy social connections,  and stress management techniques were discussed. C.  A full color page of  Calorie density of various food groups per pound showing examples of each food groups was provided to the patient.   Mild hypercalcemia: Recurrent, PTH independent she will be offered 24-hour urine calcium  and creatinine excretion.    Her next labs will include PTH/calcium , magnesium, phosphorus.  She will benefit from low-dose vitamin D  supplement without calcium .  She is advised to maintain that nephrology consult she is being considered for given intermittently increasing serum creatinine on the background of family history of polycystic kidney disease.   - she is advised to maintain close  follow up with Joshua Debby CROME, MD for primary care needs.   I spent  26  minutes in the care of the patient today including review of labs from Thyroid  Function, CMP, and other relevant labs ; imaging/biopsy records (current and previous including abstractions from other facilities); face-to-face time discussing  her lab results and symptoms, medications doses, her options of short and long term treatment based on the latest standards of care / guidelines;   and documenting the encounter.  Arantza S Mcmonigle  participated in the discussions, expressed understanding, and voiced agreement with the above plans.  All questions were answered to her satisfaction. she is encouraged to contact clinic should she have any questions or concerns prior to her return visit. Dear Patient: Feel free to review your progress notes.  If you are reviewing this progress note and have questions about the meaning of /or medical terms being used, please make a note and address it at your next follow-up appointment.  Medical notes are meant to be a communication tool between medical professionals and require medical terms to be used for efficiency and insurance approval.   Follow up plan: Return in about 6 months (around 03/18/2025) for F/U with Pre-visit Labs, 24 Hr Urine Ca & Cr.   Ranny Earl, MD [facility] [address] [place], [place] [address] Phone: [phone]  Fax: [phone]     09/18/2024, 10:55 AM  This note was partially dictated with voice recognition software. Similar sounding words can be transcribed inadequately or may not  be corrected upon review.  "

## 2024-09-20 LAB — LIPID PANEL
Chol/HDL Ratio: 3.5 ratio (ref 0.0–4.4)
Cholesterol, Total: 159 mg/dL (ref 100–199)
HDL: 46 mg/dL
LDL Chol Calc (NIH): 95 mg/dL (ref 0–99)
Triglycerides: 96 mg/dL (ref 0–149)
VLDL Cholesterol Cal: 18 mg/dL (ref 5–40)

## 2024-09-20 LAB — COMPREHENSIVE METABOLIC PANEL WITH GFR
ALT: 15 [IU]/L (ref 0–32)
AST: 20 [IU]/L (ref 0–40)
Albumin: 4.6 g/dL (ref 3.8–4.9)
Alkaline Phosphatase: 97 [IU]/L (ref 49–135)
BUN/Creatinine Ratio: 11 (ref 9–23)
BUN: 16 mg/dL (ref 6–24)
Bilirubin Total: 1.2 mg/dL (ref 0.0–1.2)
CO2: 22 mmol/L (ref 20–29)
Calcium: 10.3 mg/dL — ABNORMAL HIGH (ref 8.7–10.2)
Chloride: 101 mmol/L (ref 96–106)
Creatinine, Ser: 1.47 mg/dL — ABNORMAL HIGH (ref 0.57–1.00)
Globulin, Total: 3.4 g/dL (ref 1.5–4.5)
Glucose: 95 mg/dL (ref 70–99)
Potassium: 4.5 mmol/L (ref 3.5–5.2)
Sodium: 138 mmol/L (ref 134–144)
Total Protein: 8 g/dL (ref 6.0–8.5)
eGFR: 42 mL/min/{1.73_m2} — ABNORMAL LOW

## 2024-09-20 LAB — PHOSPHORUS: Phosphorus: 3.5 mg/dL (ref 3.0–4.3)

## 2024-09-20 LAB — PTH, INTACT AND CALCIUM: PTH: 33 pg/mL (ref 15–65)

## 2024-09-20 LAB — ALDOSTERONE + RENIN ACTIVITY W/ RATIO
Aldos/Renin Ratio: 13.6 (ref 0.0–20.0)
Aldosterone: 11.9 ng/dL (ref 0.0–30.0)
Renin Activity, Plasma: 0.873 ng/mL/h (ref 0.167–5.380)

## 2024-09-20 LAB — MAGNESIUM: Magnesium: 1.6 mg/dL (ref 1.6–2.3)

## 2025-03-19 ENCOUNTER — Ambulatory Visit: Admitting: "Endocrinology
